# Patient Record
Sex: Female | Born: 1960
Health system: Southern US, Community
[De-identification: ages and names within clinical notes are randomized; demographics above are authoritative.]

## PROBLEM LIST (undated history)

## (undated) DIAGNOSIS — M8580 Other specified disorders of bone density and structure, unspecified site: Secondary | ICD-10-CM

## (undated) DIAGNOSIS — J189 Pneumonia, unspecified organism: Secondary | ICD-10-CM

## (undated) DIAGNOSIS — I10 Essential (primary) hypertension: Secondary | ICD-10-CM

## (undated) HISTORY — PX: ABDOMINAL HYSTERECTOMY: SHX81

## (undated) HISTORY — DX: Other specified disorders of bone density and structure, unspecified site: M85.80

---

## 1996-01-16 HISTORY — PX: TUBAL LIGATION: SHX77

## 1996-01-16 HISTORY — PX: OOPHORECTOMY: SHX86

## 2003-01-16 HISTORY — PX: ENDOMETRIAL ABLATION: SHX621

## 2010-12-09 ENCOUNTER — Emergency Department
Admission: EM | Admit: 2010-12-09 | Discharge: 2010-12-09 | Disposition: A | Discharge status: Home Health | Payer: 59 | Source: Home / Self Care | Attending: Family Medicine | Admitting: Family Medicine

## 2010-12-09 DIAGNOSIS — J069 Acute upper respiratory infection, unspecified: Secondary | ICD-10-CM

## 2010-12-09 MED ORDER — BENZONATATE 200 MG PO CAPS: 200.0000 mg | ORAL_CAPSULE | Freq: Every day | ORAL | Status: AC

## 2010-12-09 MED ORDER — AZITHROMYCIN 250 MG PO TABS: ORAL_TABLET | ORAL | Status: AC

## 2010-12-09 NOTE — Discharge Instructions (Signed)
Take Mucinex D (guaifenesin with decongestant) twice daily for congestion.  Increase fluid intake, rest. May use Afrin nasal spray (or generic oxymetazoline) twice daily for about 5 days.  Also recommend using saline nasal spray several times daily and/or saline nasal irrigation. Stop all antihistamines for now, and other non-prescription cough/cold preparations. Begin Azithromycin if not improving about 5 days or if persistent fever develops. Follow-up with family doctor if not improving 7 to 10 days.

## 2010-12-09 NOTE — ED Notes (Signed)
States URI and productive cough x 8-9 days.

## 2010-12-09 NOTE — ED Provider Notes (Signed)
History     CSN: [id number] Arrival date & time: 12/09/2010  9:48 AM   First MD Initiated Contact with Patient 12/09/10 1009      Chief Complaint  Patient presents with  . Sinusitis  . URI     HPI Comments: Patient complains of approximately 9 day history of gradually progressive URI symptoms beginning with a mild sore throat (now resolved), followed by progressive nasal congestion.  A cough started about 7 days ago.  Complains of fatigue and initial myalgias.  Cough is now worse at night and generally non-productive during the day.  There has been no pleuritic pain, shortness of breath, or wheezes.  However, she often coughs until she gags.  She has anterior chest soreness.  She has a history of pneumonia about 5 years ago.  Patient is a 50 y.o. female presenting with URI. The history is provided by the patient.  URI The primary symptoms include fever, fatigue, cough and myalgias. Primary symptoms do not include headaches, ear pain, sore throat, swollen glands, wheezing, abdominal pain, nausea, vomiting, arthralgias or rash. The current episode started more than 1 week ago. This is a new problem.  Symptoms associated with the illness include chills, congestion and rhinorrhea. The illness is not associated with plugged ear sensation, facial pain or sinus pressure.    History reviewed. No pertinent past medical history.  Past Surgical History  Procedure Date  . Abdominal hysterectomy   . Cesarean section     twice    Family History  Problem Relation Age of Onset  . Cancer Mother     colon 2010  . Diabetes Mother   . Coronary artery disease Father     History  Substance Use Topics  . Smoking status: Never Smoker   . Smokeless tobacco: Not on file  . Alcohol Use: Yes    OB History    Grav Para Term Preterm Abortions TAB SAB Ect Mult Living                  Review of Systems  Constitutional: Positive for fever, chills and fatigue.  HENT: Positive for congestion and  rhinorrhea. Negative for ear pain, sore throat and sinus pressure.   Eyes: Negative.   Respiratory: Positive for cough. Negative for chest tightness, shortness of breath and wheezing.   Cardiovascular: Negative.   Gastrointestinal: Negative for nausea, vomiting and abdominal pain.  Genitourinary: Negative.   Musculoskeletal: Positive for myalgias. Negative for arthralgias.  Skin: Negative.  Negative for rash.  Neurological: Negative for headaches.    Allergies  Shrimp  Home Medications   Current Outpatient Rx  Name Route Sig Dispense Refill  . DEXTROMETHORPHAN-GUAIFENESIN 30-600 MG PO TB12 Oral Take 1 tablet by mouth every 12 (twelve) hours.      Marland Kitchen DIPHENHYDRAMINE HCL (SLEEP) 25 MG PO TABS Oral Take 25 mg by mouth at bedtime as needed.      . IBUPROFEN 200 MG PO TABS Oral Take 600 mg by mouth every 6 (six) hours as needed.      . AZITHROMYCIN 250 MG PO TABS  Take 2 tabs today; then begin one tab once daily for 4 more days. (Rx void after 12/16/10)    6 each 0  . BENZONATATE 200 MG PO CAPS Oral Take 1 capsule (200 mg total) by mouth at bedtime. Take as needed for cough 12 capsule 0    BP 141/88  Pulse 69  Temp(Src) 98 F (36.7 C) (Oral)  Resp 24  Ht 5\' 8"  (1.727 m)  Wt 146 lb 8 oz (66.452 kg)  BMI 22.28 kg/m2  SpO2 100%  Physical Exam  Constitutional: She is oriented to person, place, and time. She appears well-developed and well-nourished. No distress.  HENT:  Head: Normocephalic.  Right Ear: External ear normal.  Left Ear: External ear normal.  Nose: Mucosal edema and rhinorrhea present. No sinus tenderness.  Mouth/Throat: Oropharynx is clear and moist. No oropharyngeal exudate.  Eyes: Conjunctivae and EOM are normal. Pupils are equal, round, and reactive to light. Right eye exhibits no discharge. Left eye exhibits no discharge.  Neck: Normal range of motion. Neck supple.  Cardiovascular: Normal rate, regular rhythm and normal heart sounds.   Pulmonary/Chest: Effort  normal and breath sounds normal. No respiratory distress. She has no wheezes. She has no rales. She exhibits no tenderness.  Abdominal: Soft. Bowel sounds are normal. There is no tenderness.  Musculoskeletal: She exhibits no edema and no tenderness.  Lymphadenopathy:    She has cervical adenopathy.       Right cervical: Posterior cervical adenopathy present.       Left cervical: Posterior cervical adenopathy present.  Neurological: She is alert and oriented to person, place, and time.  Skin: Skin is warm and dry. She is not diaphoretic.    ED Course  Procedures none      1. Acute upper respiratory infections of unspecified site       MDM  There is no evidence of bacterial infection today.   Take Mucinex D (guaifenesin with decongestant) twice daily for congestion.  Increase fluid intake, rest. May use Afrin nasal spray (or generic oxymetazoline) twice daily for about 5 days.  Also recommend using saline nasal spray several times daily and/or saline nasal irrigation. Stop all antihistamines for now, and other non-prescription cough/cold preparations. Begin Azithromycin if not improving about 5 days or if persistent fever develops. Follow-up with family doctor if not improving 7 to 10 days.           Donna Christen, MD 12/13/10 [phone]

## 2012-01-16 HISTORY — PX: COLONOSCOPY: SHX174

## 2012-06-22 ENCOUNTER — Encounter: Payer: Self-pay | Admitting: Emergency Medicine

## 2012-06-22 ENCOUNTER — Emergency Department
Admission: EM | Admit: 2012-06-22 | Discharge: 2012-06-22 | Disposition: A | Discharge status: Home / Self Care | Payer: 59 | Source: Home / Self Care | Attending: Family Medicine | Admitting: Family Medicine

## 2012-06-22 DIAGNOSIS — N3000 Acute cystitis without hematuria: Secondary | ICD-10-CM

## 2012-06-22 HISTORY — DX: Pneumonia, unspecified organism: J18.9

## 2012-06-22 LAB — POCT URINALYSIS DIP (MANUAL ENTRY)
Bilirubin, UA: NEGATIVE
Glucose, UA: 100
Nitrite, UA: POSITIVE
Urobilinogen, UA: 1 (ref 0–1)

## 2012-06-22 MED ORDER — NITROFURANTOIN MONOHYD MACRO 100 MG PO CAPS: 100.0000 mg | ORAL_CAPSULE | Freq: Two times a day (BID) | ORAL | Status: DC

## 2012-06-22 NOTE — ED Notes (Signed)
Dysuria since last night. Took AZO this a.m.

## 2012-06-22 NOTE — ED Provider Notes (Signed)
History     CSN: [id number]  Arrival date & time 06/22/12  1434   First MD Initiated Contact with Patient 06/22/12 1604      Chief Complaint  Patient presents with  . Dysuria       HPI Comments: Patient developed dysuria and frequency last night.  Patient is a 52 y.o. female presenting with dysuria. The history is provided by the patient.  Dysuria Pain quality:  Burning Pain severity:  Mild Onset quality:  Sudden Duration:  1 day Timing:  Constant Progression:  Worsening Chronicity:  New Recent urinary tract infections: no   Relieved by:  Nothing Ineffective treatments: AZO. Urinary symptoms: frequent urination, hematuria and hesitancy   Urinary symptoms: no discolored urine, no foul-smelling urine and no bladder incontinence   Associated symptoms: no abdominal pain, no fever, no flank pain, no nausea, no vaginal discharge and no vomiting     Past Medical History  Diagnosis Date  . Pneumonia     Past Surgical History  Procedure Laterality Date  . Abdominal hysterectomy    . Cesarean section      twice    Family History  Problem Relation Age of Onset  . Cancer Mother     colon 2010  . Diabetes Mother   . Coronary artery disease Father     History  Substance Use Topics  . Smoking status: Never Smoker   . Smokeless tobacco: Not on file  . Alcohol Use: Yes    OB History   Grav Para Term Preterm Abortions TAB SAB Ect Mult Living                  Review of Systems  Constitutional: Negative for fever.  Gastrointestinal: Negative for nausea, vomiting and abdominal pain.  Genitourinary: Positive for dysuria. Negative for flank pain and vaginal discharge.  All other systems reviewed and are negative.    Allergies  Shrimp  Home Medications   Current Outpatient Rx  Name  Route  Sig  Dispense  Refill  . dextromethorphan-guaiFENesin (MUCINEX DM) 30-600 MG per 12 hr tablet   Oral   Take 1 tablet by mouth every 12 (twelve) hours.           .  diphenhydrAMINE (SOMINEX) 25 MG tablet   Oral   Take 25 mg by mouth at bedtime as needed.           Marland Kitchen ibuprofen (ADVIL,MOTRIN) 200 MG tablet   Oral   Take 600 mg by mouth every 6 (six) hours as needed.           . nitrofurantoin, macrocrystal-monohydrate, (MACROBID) 100 MG capsule   Oral   Take 1 capsule (100 mg total) by mouth 2 (two) times daily.   14 capsule   0     BP 139/64  Pulse 67  Temp(Src) 98.3 F (36.8 C) (Oral)  Resp 16  Ht 5\' 8"  (1.727 m)  Wt 140 lb (63.504 kg)  BMI 21.29 kg/m2  SpO2 100%  Physical Exam Nursing notes and Vital Signs reviewed. Appearance:  Patient appears healthy, stated age, and in no acute distress Eyes:  Pupils are equal, round, and reactive to light and accomodation.  Extraocular movement is intact.  Conjunctivae are not inflamed   Mouth/Pharynx:  Normal; moist mucous membranes  Neck:  Supple.  No adenopathy Lungs:  Clear to auscultation.  Breath sounds are equal.  Heart:  Regular rate and rhythm without murmurs, rubs, or gallops.  Abdomen:  Nontender without  masses or hepatosplenomegaly.  Bowel sounds are present.  No CVA or flank tenderness.  Extremities:  No edema.  No calf tenderness Skin:  No rash present.   ED Course  Procedures  none  Labs Reviewed  POCT URINALYSIS DIP (MANUAL ENTRY):  GLU 100mg /dL; BLO large; PRO 100mg /dL; NIT Positive; LEU large      1. Acute cystitis       MDM  Begin Macrobid.  Urine culture pending Increase fluid intake.  May continue AZO for two days. Followup with Family Doctor if not improved in one week.         Lattie Haw, MD 06/25/12 [phone]

## 2012-06-24 LAB — URINE CULTURE: Colony Count: 70000

## 2012-06-25 ENCOUNTER — Telehealth: Payer: Self-pay | Admitting: Emergency Medicine

## 2012-09-29 ENCOUNTER — Encounter: Payer: Self-pay | Admitting: Family

## 2012-09-29 ENCOUNTER — Ambulatory Visit (INDEPENDENT_AMBULATORY_CARE_PROVIDER_SITE_OTHER): Payer: 59 | Admitting: Family

## 2012-09-29 ENCOUNTER — Ambulatory Visit (HOSPITAL_BASED_OUTPATIENT_CLINIC_OR_DEPARTMENT_OTHER)
Admission: RE | Admit: 2012-09-29 | Discharge: 2012-09-29 | Disposition: A | Discharge status: Home / Self Care | Payer: 59 | Source: Ambulatory Visit | Attending: Family | Admitting: Family

## 2012-09-29 ENCOUNTER — Other Ambulatory Visit: Payer: Self-pay | Admitting: Family

## 2012-09-29 VITALS — BP 128/88 | HR 76 | Temp 97.8°F | Resp 16 | Ht 67.5 in | Wt 141.1 lb

## 2012-09-29 DIAGNOSIS — Z0000 Encounter for general adult medical examination without abnormal findings: Secondary | ICD-10-CM | POA: Insufficient documentation

## 2012-09-29 DIAGNOSIS — Z1231 Encounter for screening mammogram for malignant neoplasm of breast: Secondary | ICD-10-CM

## 2012-09-29 DIAGNOSIS — Z136 Encounter for screening for cardiovascular disorders: Secondary | ICD-10-CM

## 2012-09-29 NOTE — Patient Instructions (Addendum)
You will be contacted about your referral to Dr. Leone Payor for colonoscopy. Please schedule your bone density at the front desk. Schedule mammogram on the first floor in the imaging department. Return fasting at your earliest convenience for lab work. Follow up in 1 year for annual physical, sooner if problems/concerns. Welcome to [facility]!

## 2012-09-29 NOTE — Progress Notes (Signed)
Subjective:    Patient ID: Dominique Rice, female    DOB: Dec 15, 1960, 52 y.o.   MRN: [id number]  HPI  Dominique Rice is a 52 yr old female who presents today to establish care.  Has not had a pcp.    Patient presents today for complete physical.  Immunizations: will get flu shot with work. Tetanus up to date. Diet: reports healthy diet.  Exercise: not exercising regularly Colonoscopy: due  Dexa: due Pap Smear: s/p hysterectomy (has 1 ovary)  Mammogram:  due    Review of Systems  Constitutional: Negative for unexpected weight change.  HENT: Negative for hearing loss and congestion.   Eyes: Negative for visual disturbance.  Respiratory: Negative for cough.   Cardiovascular: Negative for chest pain, palpitations and leg swelling.  Gastrointestinal: Negative for vomiting, diarrhea and constipation.  Genitourinary: Negative for dysuria and frequency.  Musculoskeletal: Negative for back pain.       Mild arthritis in hands  Skin: Negative for rash.  Neurological: Negative for headaches.  Hematological: Negative for adenopathy.  Psychiatric/Behavioral:       Denies depression/anxiety   Past Medical History  Diagnosis Date  . Pneumonia     History   Social History  . Marital Status: Married    Spouse Name: N/A    Number of Children: N/A  . Years of Education: N/A   Occupational History  . Not on file.   Social History Main Topics  . Smoking status: Never Smoker   . Smokeless tobacco: Not on file  . Alcohol Use: Yes  . Drug Use: No  . Sexual Activity: Not on file   Other Topics Concern  . 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 Abdominal hysterectomy    . Cesarean section      twice  . Tubal ligation  1998  . Endometrial ablation  2005  .  Oophorectomy Left 1998    Family History  Problem Relation Age of Onset  . Cancer Mother 33    colon 2010  . Diabetes Mother     type II  . Coronary artery disease Father     61 had CABG    Allergies  Allergen Reactions  . Shrimp [Shellfish Allergy] Nausea And Vomiting    09/29/12  Pt states she can tolerate betadine and IV dyes. Tf,cma(aama)    No current outpatient prescriptions on file prior to visit.   No current facility-administered medications on file prior to visit.    BP 128/88  Pulse 76  Temp(Src) 97.8 F (36.6 C) (Oral)  Resp 16  Ht 5' 7.5" (1.715 m)  Wt 141 lb 1.3 oz (63.993 kg)  BMI 21.76 kg/m2  SpO2 96%       Objective:   Physical Exam  Physical Exam  Constitutional: She is oriented to person, place, and time. She appears well-developed and well-nourished. No distress.  HENT:  Head: Normocephalic and atraumatic.  Right Ear: Tympanic membrane and ear canal normal.  Left Ear: Tympanic membrane and ear canal normal.  Mouth/Throat: Oropharynx is clear and moist.  Eyes: Pupils are equal, round, and reactive to light. No scleral icterus.  Neck: Normal range of motion. No thyromegaly present.  Cardiovascular: Normal rate and regular rhythm.   No murmur heard. Pulmonary/Chest: Effort normal and breath sounds normal. No respiratory distress. He has no wheezes. She has no rales. She exhibits no tenderness.  Abdominal: Soft. Bowel sounds are normal. He exhibits no distension and no mass. There is no tenderness. There is no rebound and no guarding.  Musculoskeletal: She exhibits no edema.  Lymphadenopathy:    She has no cervical adenopathy.  Neurological: She is alert and oriented to person, place, and time. She has normal patellar reflexes. She exhibits normal muscle tone. Coordination normal.  Skin: Skin is warm and dry.  Psychiatric: She has a normal mood and affect. Her behavior is normal. Judgment and thought content normal.  Breasts: Examined  lying Right: Without masses, retractions, discharge or axillary adenopathy.  Left: Without masses, retractions, discharge or axillary adenopathy.  Pelvic: deferred to gyn      Assessment & Plan:         Assessment & Plan:

## 2012-09-29 NOTE — Assessment & Plan Note (Addendum)
Recommended regular exercise. Refer for screening colo, dexa, mammogram. She will return fasting for lab work. EKG today.

## 2012-09-30 ENCOUNTER — Ambulatory Visit (INDEPENDENT_AMBULATORY_CARE_PROVIDER_SITE_OTHER)
Admission: RE | Admit: 2012-09-30 | Discharge: 2012-09-30 | Disposition: A | Discharge status: Home / Self Care | Payer: 59 | Source: Ambulatory Visit | Attending: Family | Admitting: Family

## 2012-09-30 DIAGNOSIS — Z0000 Encounter for general adult medical examination without abnormal findings: Secondary | ICD-10-CM

## 2012-09-30 LAB — CBC WITH DIFFERENTIAL/PLATELET
Basophils Absolute: 0 10*3/uL (ref 0.0–0.1)
Basophils Relative: 1 % (ref 0–1)
Eosinophils Relative: 1 % (ref 0–5)
Lymphocytes Relative: 46 % (ref 12–46)
MCHC: 34.2 g/dL (ref 30.0–36.0)
Monocytes Absolute: 0.3 10*3/uL (ref 0.1–1.0)
Neutro Abs: 2.1 10*3/uL (ref 1.7–7.7)
Platelets: 254 10*3/uL (ref 150–400)
RDW: 13.2 % (ref 11.5–15.5)
WBC: 4.5 10*3/uL (ref 4.0–10.5)

## 2012-09-30 LAB — HEPATIC FUNCTION PANEL
Alkaline Phosphatase: 49 U/L (ref 39–117)
Bilirubin, Direct: 0.1 mg/dL (ref 0.0–0.3)
Indirect Bilirubin: 0.5 mg/dL (ref 0.0–0.9)
Total Protein: 7.1 g/dL (ref 6.0–8.3)

## 2012-09-30 LAB — LIPID PANEL
HDL: 75 mg/dL (ref >39)
LDL Cholesterol: 96 mg/dL (ref 0–99)
Triglycerides: 58 mg/dL (ref <150)

## 2012-09-30 LAB — BASIC METABOLIC PANEL WITH GFR
BUN: 19 mg/dL (ref 6–23)
Calcium: 8.9 mg/dL (ref 8.4–10.5)
Creat: 0.72 mg/dL (ref 0.50–1.10)
GFR, Est Non African American: >89 mL/min

## 2012-09-30 LAB — TSH: TSH: 1.643 u[IU]/mL (ref 0.350–4.500)

## 2012-10-01 ENCOUNTER — Encounter: Payer: Self-pay | Admitting: Family

## 2012-10-01 LAB — URINALYSIS, ROUTINE W REFLEX MICROSCOPIC
Bilirubin Urine: NEGATIVE
Glucose, UA: NEGATIVE mg/dL
Ketones, ur: NEGATIVE mg/dL
Specific Gravity, Urine: 1.01 (ref 1.005–1.030)

## 2012-10-06 ENCOUNTER — Encounter: Payer: Self-pay | Admitting: Internal Medicine

## 2012-10-15 ENCOUNTER — Encounter: Payer: Self-pay | Admitting: Family

## 2012-10-20 ENCOUNTER — Encounter: Payer: Self-pay | Admitting: Family

## 2012-10-20 ENCOUNTER — Telehealth: Payer: Self-pay | Admitting: Family

## 2012-10-20 DIAGNOSIS — M8580 Other specified disorders of bone density and structure, unspecified site: Secondary | ICD-10-CM | POA: Insufficient documentation

## 2012-10-20 NOTE — Telephone Encounter (Signed)
See my chart message

## 2012-11-20 ENCOUNTER — Other Ambulatory Visit: Payer: Self-pay

## 2012-11-25 ENCOUNTER — Encounter: Payer: Self-pay | Admitting: Family

## 2012-12-02 ENCOUNTER — Ambulatory Visit (AMBULATORY_SURGERY_CENTER): Payer: Self-pay | Admitting: *Deleted

## 2012-12-02 VITALS — Ht 68.0 in | Wt 145.2 lb

## 2012-12-02 DIAGNOSIS — Z800 Family history of malignant neoplasm of digestive organs: Secondary | ICD-10-CM

## 2012-12-02 DIAGNOSIS — Z1211 Encounter for screening for malignant neoplasm of colon: Secondary | ICD-10-CM

## 2012-12-02 MED ORDER — NA SULFATE-K SULFATE-MG SULF 17.5-3.13-1.6 GM/177ML PO SOLN: 1.0000 | Freq: Once | ORAL | Status: DC

## 2012-12-02 NOTE — Progress Notes (Signed)
Denies allergies to eggs or soy products. Denies complications with sedation or anesthesia. 

## 2012-12-15 ENCOUNTER — Ambulatory Visit (AMBULATORY_SURGERY_CENTER): Payer: 59 | Admitting: Internal Medicine

## 2012-12-15 ENCOUNTER — Encounter: Payer: Self-pay | Admitting: Internal Medicine

## 2012-12-15 VITALS — BP 125/90 | HR 59 | Temp 97.2°F | Resp 20 | Ht 68.0 in | Wt 145.0 lb

## 2012-12-15 DIAGNOSIS — Z1211 Encounter for screening for malignant neoplasm of colon: Secondary | ICD-10-CM

## 2012-12-15 DIAGNOSIS — K5730 Diverticulosis of large intestine without perforation or abscess without bleeding: Secondary | ICD-10-CM

## 2012-12-15 MED ORDER — SODIUM CHLORIDE 0.9 % IV SOLN: 500.0000 mL | INTRAVENOUS | Status: DC

## 2012-12-15 NOTE — Patient Instructions (Addendum)
You have very mild diverticulosis but everything else was ok - normal - and the prep was great.  Next routine colonoscopy in 10 years - 2024.  I appreciate the opportunity to care for you. Iva Boop, MD, FACG   YOU HAD AN ENDOSCOPIC PROCEDURE TODAY AT THE [facility]: Refer to the procedure report that was given to you for any specific questions about what was found during the examination.  If the procedure report does not answer your questions, please call your gastroenterologist to clarify.  If you requested that your care partner not be given the details of your procedure findings, then the procedure report has been included in a sealed envelope for you to review at your convenience later.  YOU SHOULD EXPECT: Some feelings of bloating in the abdomen. Passage of more gas than usual.  Walking can help get rid of the air that was put into your GI tract during the procedure and reduce the bloating. If you had a lower endoscopy (such as a colonoscopy or flexible sigmoidoscopy) you may notice spotting of blood in your stool or on the toilet paper. If you underwent a bowel prep for your procedure, then you may not have a normal bowel movement for a few days.  DIET: Your first meal following the procedure should be a light meal and then it is ok to progress to your normal diet.  A half-sandwich or bowl of soup is an example of a good first meal.  Heavy or fried foods are harder to digest and may make you feel nauseous or bloated.  Likewise meals heavy in dairy and vegetables can cause extra gas to form and this can also increase the bloating.  Drink plenty of fluids but you should avoid alcoholic beverages for 24 hours.  ACTIVITY: Your care partner should take you home directly after the procedure.  You should plan to take it easy, moving slowly for the rest of the day.  You can resume normal activity the day after the procedure however you should NOT DRIVE or use heavy machinery for 24  hours (because of the sedation medicines used during the test).    SYMPTOMS TO REPORT IMMEDIATELY: A gastroenterologist can be reached at any hour.  During normal business hours, 8:30 AM to 5:00 PM Monday through Friday, call [phone].  After hours and on weekends, please call the GI answering service at [phone] who will take a message and have the physician on call contact you.   Following lower endoscopy (colonoscopy or flexible sigmoidoscopy):  Excessive amounts of blood in the stool  Significant tenderness or worsening of abdominal pains  Swelling of the abdomen that is new, acute  Fever of 100F or higher FOLLOW UP: If any biopsies were taken you will be contacted by phone or by letter within the next 1-3 weeks.  Call your gastroenterologist if you have not heard about the biopsies in 3 weeks.  Our staff will call the home number listed on your records the next business day following your procedure to check on you and address any questions or concerns that you may have at that time regarding the information given to you following your procedure. This is a courtesy call and so if there is no answer at the home number and we have not heard from you through the emergency physician on call, we will assume that you have returned to your regular daily activities without incident.  SIGNATURES/CONFIDENTIALITY: You and/or your care partner have  signed paperwork which will be entered into your electronic medical record.  These signatures attest to the fact that that the information above on your After Visit Summary has been reviewed and is understood.  Full responsibility of the confidentiality of this discharge information lies with you and/or your care-partner.   Diverticulosis information given.  Next colonoscopy 2024-10 years!

## 2012-12-15 NOTE — Op Note (Signed)
[facility] [address] [place], [address]   COLONOSCOPY PROCEDURE REPORT  PATIENT: Dominique Rice, Dominique Rice  MR#: [id number] BIRTHDATE: 12-06-1960 , 52  yrs. old GENDER: Female ENDOSCOPIST: Iva Boop, MD, [facility] REFERRED NW:[id number] Peggyann Juba, FNP PROCEDURE DATE:  12/15/2012 PROCEDURE:   Colonoscopy, screening First Screening Colonoscopy - Avg.  risk and is 50 yrs.  old or older Yes.  Prior Negative Screening - Now for repeat screening. N/A  History of Adenoma - Now for follow-up colonoscopy & has been > or = to 3 yrs.  N/A  Polyps Removed Today? No.  Recommend repeat exam, <10 yrs? No. ASA CLASS:   Class II INDICATIONS:average risk screening and first colonoscopy. MEDICATIONS: propofol (Diprivan) 200mg  IV, MAC sedation, administered by CRNA, and These medications were titrated to patient response per physician's verbal order  DESCRIPTION OF PROCEDURE:   After the risks benefits and alternatives of the procedure were thoroughly explained, informed consent was obtained.  A digital rectal exam revealed no abnormalities of the rectum.   The LB [id number]  endoscope was introduced through the anus and advanced to the cecum, which was identified by both the appendix and ileocecal valve. No adverse events experienced.   The quality of the prep was excellent using Suprep  The instrument was then slowly withdrawn as the colon was fully examined.      COLON FINDINGS: There was mild scattered diverticulosis noted in the sigmoid colon.   The colon mucosa was otherwise normal.   A right colon retroflexion was performed.  Retroflexed views revealed no abnormalities. The time to cecum=2 minutes 42 seconds.  Withdrawal time=8 minutes 28 seconds.  The scope was withdrawn and the procedure completed. COMPLICATIONS: There were no complications.  ENDOSCOPIC IMPRESSION: 1.   There was mild diverticulosis noted in the sigmoid colon 2.   The colon mucosa was otherwise  normal - excellent prep - first colonoscopy  RECOMMENDATIONS: Repeat routine colonoscopy 10 years - 2024   eSigned:  Iva Boop, MD, [facility] 12/15/2012 11:25 AM  cc: Sandford Craze FNP and The Patient

## 2012-12-15 NOTE — Progress Notes (Signed)
Patient did not experience any of the following events: a burn prior to discharge; a fall within the facility; wrong site/side/patient/procedure/implant event; or a hospital transfer or hospital admission upon discharge from the facility. (G8907) Patient did not have preoperative order for IV antibiotic SSI prophylaxis. (G8918)  

## 2012-12-16 ENCOUNTER — Telehealth: Payer: Self-pay | Admitting: *Deleted

## 2012-12-16 NOTE — Telephone Encounter (Signed)
Message left

## 2013-09-14 ENCOUNTER — Other Ambulatory Visit: Payer: Self-pay | Admitting: Family

## 2013-09-14 DIAGNOSIS — Z1231 Encounter for screening mammogram for malignant neoplasm of breast: Secondary | ICD-10-CM

## 2013-10-09 ENCOUNTER — Ambulatory Visit (HOSPITAL_BASED_OUTPATIENT_CLINIC_OR_DEPARTMENT_OTHER): Payer: 59

## 2013-10-13 ENCOUNTER — Ambulatory Visit (HOSPITAL_BASED_OUTPATIENT_CLINIC_OR_DEPARTMENT_OTHER)
Admission: RE | Admit: 2013-10-13 | Discharge: 2013-10-13 | Disposition: A | Discharge status: Home / Self Care | Payer: 59 | Source: Ambulatory Visit | Attending: Family | Admitting: Family

## 2013-10-13 DIAGNOSIS — Z803 Family history of malignant neoplasm of breast: Secondary | ICD-10-CM | POA: Diagnosis not present

## 2013-10-13 DIAGNOSIS — Z1231 Encounter for screening mammogram for malignant neoplasm of breast: Secondary | ICD-10-CM | POA: Diagnosis present

## 2013-11-24 ENCOUNTER — Encounter: Payer: Self-pay | Admitting: Family

## 2013-11-24 ENCOUNTER — Ambulatory Visit (INDEPENDENT_AMBULATORY_CARE_PROVIDER_SITE_OTHER): Payer: 59 | Admitting: Family

## 2013-11-24 VITALS — BP 120/70 | HR 63 | Temp 97.9°F | Resp 16 | Ht 67.5 in | Wt 150.4 lb

## 2013-11-24 DIAGNOSIS — Z0000 Encounter for general adult medical examination without abnormal findings: Secondary | ICD-10-CM

## 2013-11-24 LAB — LIPID PANEL
Cholesterol: 192 mg/dL (ref 0–200)
HDL: 66.8 mg/dL (ref >39.00)
LDL Cholesterol: 114 mg/dL — ABNORMAL HIGH (ref 0–99)
NONHDL: 125.2
Total CHOL/HDL Ratio: 3
Triglycerides: 56 mg/dL (ref 0.0–149.0)
VLDL: 11.2 mg/dL (ref 0.0–40.0)

## 2013-11-24 LAB — CBC WITH DIFFERENTIAL/PLATELET
BASOS PCT: 0.4 % (ref 0.0–3.0)
Basophils Absolute: 0 10*3/uL (ref 0.0–0.1)
EOS ABS: 0 10*3/uL (ref 0.0–0.7)
EOS PCT: 0.5 % (ref 0.0–5.0)
HEMATOCRIT: 38.5 % (ref 36.0–46.0)
Hemoglobin: 12.7 g/dL (ref 12.0–15.0)
LYMPHS ABS: 1.6 10*3/uL (ref 0.7–4.0)
Lymphocytes Relative: 34 % (ref 12.0–46.0)
MCHC: 33.1 g/dL (ref 30.0–36.0)
MCV: 91.9 fl (ref 78.0–100.0)
MONO ABS: 0.3 10*3/uL (ref 0.1–1.0)
Monocytes Relative: 6.4 % (ref 3.0–12.0)
NEUTROS ABS: 2.8 10*3/uL (ref 1.4–7.7)
NEUTROS PCT: 58.7 % (ref 43.0–77.0)
Platelets: 271 10*3/uL (ref 150.0–400.0)
RBC: 4.19 Mil/uL (ref 3.87–5.11)
RDW: 13.1 % (ref 11.5–15.5)
WBC: 4.7 10*3/uL (ref 4.0–10.5)

## 2013-11-24 LAB — HEPATIC FUNCTION PANEL
ALBUMIN: 3.5 g/dL (ref 3.5–5.2)
ALT: 13 U/L (ref 0–35)
AST: 23 U/L (ref 0–37)
Alkaline Phosphatase: 53 U/L (ref 39–117)
BILIRUBIN DIRECT: 0 mg/dL (ref 0.0–0.3)
Total Bilirubin: 0.5 mg/dL (ref 0.2–1.2)
Total Protein: 7.1 g/dL (ref 6.0–8.3)

## 2013-11-24 LAB — BASIC METABOLIC PANEL
BUN: 13 mg/dL (ref 6–23)
CHLORIDE: 104 meq/L (ref 96–112)
CO2: 28 mEq/L (ref 19–32)
CREATININE: 0.6 mg/dL (ref 0.4–1.2)
Calcium: 9.1 mg/dL (ref 8.4–10.5)
GFR: 111.08 mL/min (ref >60.00)
Glucose, Bld: 78 mg/dL (ref 70–99)
Potassium: 4.3 mEq/L (ref 3.5–5.1)
Sodium: 139 mEq/L (ref 135–145)

## 2013-11-24 LAB — TSH: TSH: 1.28 u[IU]/mL (ref 0.35–4.50)

## 2013-11-24 NOTE — Progress Notes (Signed)
Pre visit review using our clinic review tool, if applicable. No additional management support is needed unless otherwise documented below in the visit note. 

## 2013-11-24 NOTE — Progress Notes (Signed)
Subjective:    Patient ID: Dominique Rice, female    DOB: 1960/01/23, 53 y.o.   MRN: [id number]  HPI   Subjective:     Dominique Rice is a 53 y.o. female and is here for a comprehensive physical exam. The patient reports mild sore throat off/on x 1 month, will mild swollen gland on the left.  .  Patient presents today for complete physical.  Immunizations: up to date  Diet: reports healthy diet Exercise: walks 3 days a week- 45 minutes Colonoscopy: 2014- due 2019 Dexa: 2014 Pap Smear: hysterectomy Mammogram:9/15    History   Social History  . Marital Status: Married    Spouse Name: N/A    Number of Children: N/A  . Years of Education: N/A   Occupational History  . Not on file.   Social History Main Topics  . Smoking status: Never Smoker   . Smokeless tobacco: Never Used  . Alcohol Use: 1.8 oz/week    3 Glasses of wine per week  . Drug Use: No  . Sexual Activity: Not on file   Other Topics Concern  . 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 PAP SMEAR  09/28/1978  . INFLUENZA VACCINE  08/15/2013  . TETANUS/TDAP  01/16/2015  . MAMMOGRAM  10/14/2015  . COLONOSCOPY  12/16/2022    The following portions of the patient's history were reviewed and updated as appropriate: allergies, current medications, past family history, past medical history, past social history, past surgical history and problem list.  Review of Systems Objective:   Assessment:    Healthy female exam. Obtain EKG.  Immunizations, colo, dexa, mammo up to date.      Plan:     See After Visit Summary for Counseling Recommendations     Review of Systems  Constitutional: Negative for unexpected weight change.  HENT: Negative for rhinorrhea.   Respiratory: Negative for cough and shortness  of breath.   Cardiovascular: Negative for chest pain and leg swelling.  Gastrointestinal: Negative for nausea, vomiting, diarrhea and blood in stool.  Genitourinary: Negative for dysuria, frequency and hematuria.  Musculoskeletal: Negative for myalgias and arthralgias.  Skin: Negative for rash.  Neurological: Negative for headaches.  Hematological: Does not bruise/bleed easily.  Psychiatric/Behavioral:       Denies depression/anxiety   Past Medical History  Diagnosis Date  . Pneumonia     History   Social History  . Marital Status: Married    Spouse Name: N/A    Number of Children: N/A  . Years of Education: N/A   Occupational History  . Not on file.   Social History Main Topics  . Smoking status: Never Smoker   . Smokeless tobacco: Never Used  . Alcohol Use: 1.8 oz/week    3 Glasses of wine per week  . Drug Use: No  . Sexual Activity: Not on file   Other Topics Concern  . 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 Abdominal hysterectomy    . Cesarean section      twice  . Tubal ligation  1998  . Endometrial ablation  2005  . Oophorectomy Left 1998    Family History  Problem Relation Age of Onset  . Cancer Mother 6172    colon 2010  . Diabetes Mother     type II  . Colon cancer Mother   . Macular degeneration Mother   . Coronary artery disease Father     5467 had CABG  . Esophageal cancer Neg Hx   . Rectal cancer Neg Hx   . Stomach cancer Neg Hx   . Hodgkin's lymphoma Son     Allergies  Allergen Reactions  . Shrimp [Shellfish Allergy] Nausea And Vomiting    09/29/12  Pt states she can tolerate betadine and IV dyes. Tf,cma(aama)    Current Outpatient Prescriptions on File Prior to Visit  Medication Sig Dispense Refill  .  Calcium-Magnesium-Vitamin D (CALCIUM 500 PO) Take 1 tablet by mouth 2 (two) times daily.    . Multiple Vitamin (MULTIVITAMIN) tablet Take 1 tablet by mouth daily.     No current facility-administered medications on file prior to visit.    BP 120/70 mmHg  Pulse 63  Temp(Src) 97.9 F (36.6 C) (Oral)  Resp 16  Ht 5' 7.5" (1.715 m)  Wt 150 lb 6.4 oz (68.221 kg)  BMI 23.19 kg/m2  SpO2 99%        Objective:   Physical Exam Physical Exam  Constitutional: She is oriented to person, place, and time. She appears well-developed and well-nourished. No distress.  HENT:  Head: Normocephalic and atraumatic.  Right Ear: Tympanic membrane and ear canal normal.  Left Ear: Tympanic membrane and ear canal normal.  Mouth/Throat: Oropharynx is clear and moist.  Eyes: Pupils are equal, round, and reactive to light. No scleral icterus.  Neck: Normal range of motion. No thyromegaly present.  Cardiovascular: Normal rate and regular rhythm.   No murmur heard. Pulmonary/Chest: Effort normal and breath sounds normal. No respiratory distress. He has no wheezes. She has no rales. She exhibits no tenderness.  Abdominal: Soft. Bowel sounds are normal. He exhibits no distension and no mass. There is no tenderness. There is no rebound and no guarding.  Musculoskeletal: She exhibits no edema.  Lymphadenopathy:    She has upper limit normal left anterior cervical lymph node Neurological: She is alert and oriented to person, place, and time.  She exhibits normal muscle tone. Coordination normal.  Skin: Skin is warm and dry.  Psychiatric: She has a normal mood and affect. Her behavior is normal. Judgment and thought content normal.  Breasts: Examined lying Right: Without masses, retractions, discharge or axillary adenopathy.  Left: Without masses, retractions, discharge or axillary adenopathy. Pelvic: deferred        Assessment & Plan:    Suspect mild allergies contributing to sore throat.  Recommend  trial of zyrtec, call if symptoms worsen or if symptoms do not improve. Call if mild cervical LN enlargement does not resolve.       Assessment & Plan:

## 2013-11-24 NOTE — Patient Instructions (Signed)
Please complete lab work prior to leaving. Follow up in 1 year, sooner if problems/concerns.  

## 2013-11-25 LAB — URINALYSIS, ROUTINE W REFLEX MICROSCOPIC
BILIRUBIN URINE: NEGATIVE
HGB URINE DIPSTICK: NEGATIVE
KETONES UR: NEGATIVE
Leukocytes, UA: NEGATIVE
Nitrite: NEGATIVE
RBC / HPF: NONE SEEN (ref 0–?)
Specific Gravity, Urine: 1.01 (ref 1.000–1.030)
TOTAL PROTEIN, URINE-UPE24: NEGATIVE
UROBILINOGEN UA: 0.2 (ref 0.0–1.0)
Urine Glucose: NEGATIVE
WBC, UA: NONE SEEN (ref 0–?)
pH: 7 (ref 5.0–8.0)

## 2013-11-26 ENCOUNTER — Encounter: Payer: Self-pay | Admitting: Family

## 2014-03-17 ENCOUNTER — Encounter: Payer: Self-pay | Admitting: Physician Assistant

## 2014-03-17 ENCOUNTER — Ambulatory Visit (INDEPENDENT_AMBULATORY_CARE_PROVIDER_SITE_OTHER): Payer: 59 | Admitting: Physician Assistant

## 2014-03-17 VITALS — BP 159/82 | HR 75 | Temp 98.5°F | Resp 16 | Ht 67.5 in | Wt 152.5 lb

## 2014-03-17 DIAGNOSIS — J0110 Acute frontal sinusitis, unspecified: Secondary | ICD-10-CM

## 2014-03-17 MED ORDER — BENZONATATE 100 MG PO CAPS: 100.0000 mg | ORAL_CAPSULE | Freq: Two times a day (BID) | ORAL | Status: DC | PRN

## 2014-03-17 MED ORDER — AMOXICILLIN-POT CLAVULANATE 875-125 MG PO TABS: 1.0000 | ORAL_TABLET | Freq: Two times a day (BID) | ORAL | Status: DC

## 2014-03-17 NOTE — Patient Instructions (Signed)
Please take antibiotic as directed.  Increase fluid intake.  Use Saline nasal spray.  Take a daily multivitamin. Use Tessalon as directed.  Place a humidifier in the bedroom.  Please call or return clinic if symptoms are not improving.  Sinusitis Sinusitis is redness, soreness, and swelling (inflammation) of the paranasal sinuses. Paranasal sinuses are air pockets within the bones of your face (beneath the eyes, the middle of the forehead, or above the eyes). In healthy paranasal sinuses, mucus is able to drain out, and air is able to circulate through them by way of your nose. However, when your paranasal sinuses are inflamed, mucus and air can become trapped. This can allow bacteria and other germs to grow and cause infection. Sinusitis can develop quickly and last only a short time (acute) or continue over a long period (chronic). Sinusitis that lasts for more than 12 weeks is considered chronic.  CAUSES  Causes of sinusitis include:  Allergies.  Structural abnormalities, such as displacement of the cartilage that separates your nostrils (deviated septum), which can decrease the air flow through your nose and sinuses and affect sinus drainage.  Functional abnormalities, such as when the small hairs (cilia) that line your sinuses and help remove mucus do not work properly or are not present. SYMPTOMS  Symptoms of acute and chronic sinusitis are the same. The primary symptoms are pain and pressure around the affected sinuses. Other symptoms include:  Upper toothache.  Earache.  Headache.  Bad breath.  Decreased sense of smell and taste.  A cough, which worsens when you are lying flat.  Fatigue.  Fever.  Thick drainage from your nose, which often is green and may contain pus (purulent).  Swelling and warmth over the affected sinuses. DIAGNOSIS  Your caregiver will perform a physical exam. During the exam, your caregiver may:  Look in your nose for signs of abnormal growths in  your nostrils (nasal polyps).  Tap over the affected sinus to check for signs of infection.  View the inside of your sinuses (endoscopy) with a special imaging device with a light attached (endoscope), which is inserted into your sinuses. If your caregiver suspects that you have chronic sinusitis, one or more of the following tests may be recommended:  Allergy tests.  Nasal culture A sample of mucus is taken from your nose and sent to a lab and screened for bacteria.  Nasal cytology A sample of mucus is taken from your nose and examined by your caregiver to determine if your sinusitis is related to an allergy. TREATMENT  Most cases of acute sinusitis are related to a viral infection and will resolve on their own within 10 days. Sometimes medicines are prescribed to help relieve symptoms (pain medicine, decongestants, nasal steroid sprays, or saline sprays).  However, for sinusitis related to a bacterial infection, your caregiver will prescribe antibiotic medicines. These are medicines that will help kill the bacteria causing the infection.  Rarely, sinusitis is caused by a fungal infection. In theses cases, your caregiver will prescribe antifungal medicine. For some cases of chronic sinusitis, surgery is needed. Generally, these are cases in which sinusitis recurs more than 3 times per year, despite other treatments. HOME CARE INSTRUCTIONS   Drink plenty of water. Water helps thin the mucus so your sinuses can drain more easily.  Use a humidifier.  Inhale steam 3 to 4 times a day (for example, sit in the bathroom with the shower running).  Apply a warm, moist washcloth to your face 3 to   4 times a day, or as directed by your caregiver.  Use saline nasal sprays to help moisten and clean your sinuses.  Take over-the-counter or prescription medicines for pain, discomfort, or fever only as directed by your caregiver. SEEK IMMEDIATE MEDICAL CARE IF:  You have increasing pain or severe  headaches.  You have nausea, vomiting, or drowsiness.  You have swelling around your face.  You have vision problems.  You have a stiff neck.  You have difficulty breathing. MAKE SURE YOU:   Understand these instructions.  Will watch your condition.  Will get help right away if you are not doing well or get worse. Document Released: 01/01/2005 Document Revised: 03/26/2011 Document Reviewed: 01/16/2011 ExitCare Patient Information 2014 [employer].   

## 2014-03-17 NOTE — Progress Notes (Signed)
  Subjective:     Dominique Rice is a 54 y.o. female who presents for evaluation of symptoms of a URI, possible sinusitis. Symptoms include left ear pressure/pain, facial pain, nasal congestion, non productive cough, sinus pressure, sore throat and tooth pain. Onset of symptoms was 6 weeks ago, and has been gradually worsening since that time. Treatment to date: antihistamines, cough suppressants and decongestants.  The following portions of the patient's history were reviewed and updated as appropriate: allergies, current medications, past family history, past medical history, past social history, past surgical history and problem list.  Review of Systems Pertinent items are noted in HPI.   Objective:    BP 159/82 mmHg  Pulse 75  Temp(Src) 98.5 F (36.9 C) (Oral)  Resp 16  Ht 5' 7.5" (1.715 m)  Wt 152 lb 8 oz (69.174 kg)  BMI 23.52 kg/m2  SpO2 100% General appearance: alert, cooperative, appears stated age and no distress Head: Normocephalic, without obvious abnormality, atraumatic Ears: normal TM's and external ear canals both ears Nose: mild congestion, turbinates red, swollen, sinus tenderness bilateral Throat: lips, mucosa, and tongue normal; teeth and gums normal Lungs: clear to auscultation bilaterally Heart: regular rate and rhythm, S1, S2 normal, no murmur, click, rub or gallop   Assessment:    sinusitis   Plan:    Discussed the diagnosis and treatment of sinusitis. Suggested symptomatic OTC remedies. Nasal saline spray for congestion. Augmentin per orders. Nasal steroids per orders. Follow up as needed. Call in 5 days if symptoms aren't resolving.

## 2014-03-17 NOTE — Progress Notes (Signed)
Pre visit review using our clinic review tool, if applicable. No additional management support is needed unless otherwise documented below in the visit note/SLS  

## 2014-03-18 ENCOUNTER — Telehealth: Payer: Self-pay | Admitting: Family

## 2014-03-18 MED ORDER — DOXYCYCLINE HYCLATE 100 MG PO CAPS: 100.0000 mg | ORAL_CAPSULE | Freq: Two times a day (BID) | ORAL | Status: DC

## 2014-03-18 NOTE — Telephone Encounter (Signed)
Caller name: Rush LandmarkCoble, Smantha W Relation to pt: self  Call back number: [phone]   Reason for call:  Pt states amoxicillin-clavulanate (AUGMENTIN) 875-125 MG per tablet is causesing explosive diarrhea and vommitting. Please advise

## 2014-03-18 NOTE — Telephone Encounter (Signed)
If she is having this issue with only one dose of Augmentin, she is to stop the medication.  I have sent in a prescription for Doxycycline for her to take for sinusitis instead.

## 2014-03-18 NOTE — Telephone Encounter (Signed)
Please advise 

## 2014-03-18 NOTE — Telephone Encounter (Signed)
Spoke with Pt informed her to d/c Augmentin. Informed her Selena BattenCody has sent Doxycycline to her pharmacy. Pt verbalized understanding.

## 2014-07-02 ENCOUNTER — Encounter: Payer: Self-pay | Admitting: Physician Assistant

## 2014-07-02 ENCOUNTER — Ambulatory Visit (INDEPENDENT_AMBULATORY_CARE_PROVIDER_SITE_OTHER): Payer: 59 | Admitting: Physician Assistant

## 2014-07-02 VITALS — BP 144/76 | HR 61 | Temp 98.0°F | Resp 16 | Ht 67.5 in | Wt 152.5 lb

## 2014-07-02 DIAGNOSIS — R591 Generalized enlarged lymph nodes: Secondary | ICD-10-CM

## 2014-07-02 DIAGNOSIS — R599 Enlarged lymph nodes, unspecified: Secondary | ICD-10-CM | POA: Insufficient documentation

## 2014-07-02 LAB — CBC WITH DIFFERENTIAL/PLATELET
Basophils Absolute: 0 10*3/uL (ref 0.0–0.1)
Basophils Relative: 0.4 % (ref 0.0–3.0)
EOS PCT: 0.5 % (ref 0.0–5.0)
Eosinophils Absolute: 0 10*3/uL (ref 0.0–0.7)
HEMATOCRIT: 40.1 % (ref 36.0–46.0)
Hemoglobin: 13.2 g/dL (ref 12.0–15.0)
LYMPHS ABS: 1.9 10*3/uL (ref 0.7–4.0)
Lymphocytes Relative: 38 % (ref 12.0–46.0)
MCHC: 32.9 g/dL (ref 30.0–36.0)
MCV: 91.1 fl (ref 78.0–100.0)
MONOS PCT: 7.1 % (ref 3.0–12.0)
Monocytes Absolute: 0.4 10*3/uL (ref 0.1–1.0)
NEUTROS ABS: 2.8 10*3/uL (ref 1.4–7.7)
Neutrophils Relative %: 54 % (ref 43.0–77.0)
PLATELETS: 249 10*3/uL (ref 150.0–400.0)
RBC: 4.4 Mil/uL (ref 3.87–5.11)
RDW: 13.6 % (ref 11.5–15.5)
WBC: 5.1 10*3/uL (ref 4.0–10.5)

## 2014-07-02 NOTE — Progress Notes (Signed)
Patient presents to clinic today c/o persistent enlarged, non-tender left anterior cervical lymph node since sinus infection 3.5 months ago.  Denies any residual symptoms.  Denies fever, chills, sweats or weight changes.  Denies nodes elsewhere.  Past Medical History  Diagnosis Date  . Pneumonia     Current Outpatient Prescriptions on File Prior to Visit  Medication Sig Dispense Refill  . Calcium-Magnesium-Vitamin D (CALCIUM 500 PO) Take 1 tablet by mouth 2 (two) times daily.    . Multiple Vitamin (MULTIVITAMIN) tablet Take 1 tablet by mouth daily.     No current facility-administered medications on file prior to visit.    Allergies  Allergen Reactions  . Shrimp [Shellfish Allergy] Nausea And Vomiting    09/29/12  Pt states she can tolerate betadine and IV dyes. Tf,cma(aama)    Family History  Problem Relation Age of Onset  . Cancer Mother 78    colon 2010  . Diabetes Mother     type II  . Colon cancer Mother   . Macular degeneration Mother   . Coronary artery disease Father     75 had CABG  . Esophageal cancer Neg Hx   . Rectal cancer Neg Hx   . Stomach cancer Neg Hx   . Hodgkin's lymphoma Son     History   Social History  . Marital Status: Married    Spouse Name: N/A  . Number of Children: N/A  . Years of Education: N/A   Social History Main Topics  . Smoking status: Never Smoker   . Smokeless tobacco: Never Used  . Alcohol Use: 1.8 oz/week    3 Glasses of wine per week  . Drug Use: No  . Sexual Activity: Not on file   Other Topics Concern  . 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  All other ROS are negative.  BP 144/76 mmHg  Pulse 61  Temp(Src) 98 F (36.7 C) (Oral)  Resp 16  Ht 5' 7.5" (1.715 m)  Wt 152 lb 8 oz (69.174 kg)  BMI 23.52 kg/m2  SpO2  100%  Physical Exam  Constitutional: She is oriented to person, place, and time.  HENT:  Head: Normocephalic and atraumatic.  Right Ear: External ear normal.  Left Ear: External ear normal.  Nose: Nose normal.  Mouth/Throat: Oropharynx is clear and moist.  Eyes: Conjunctivae are normal.  Neck: Neck supple.  Cardiovascular: Normal rate, regular rhythm, normal heart sounds and intact distal pulses.   Pulmonary/Chest: Effort normal and breath sounds normal. No respiratory distress. She has no wheezes. She has no rales. She exhibits no tenderness.  Lymphadenopathy:       Head (right side): No submental, no submandibular, no tonsillar, no preauricular, no posterior auricular and no occipital adenopathy present.       Head (left side): No submental, no submandibular, no tonsillar, no preauricular, no posterior auricular and no occipital adenopathy present.    She has cervical adenopathy.       Right cervical: No superficial cervical, no deep cervical and no posterior cervical adenopathy present.      Left cervical: Superficial cervical adenopathy present. No deep cervical and no posterior cervical adenopathy present.    She has no axillary  adenopathy.       Right: No supraclavicular and no epitrochlear adenopathy present.       Left: No supraclavicular and no epitrochlear adenopathy present.  Neurological: She is alert and oriented to person, place, and time.  Skin: Skin is warm and dry. No rash noted.  Psychiatric: Affect normal.  Vitals reviewed.   Recent Results (from the past 2160 hour(s))  CBC w/Diff     Status: None   Collection Time: 07/02/14 11:01 AM  Result Value Ref Range   WBC 5.1 4.0 - 10.5 K/uL   RBC 4.40 3.87 - 5.11 Mil/uL   Hemoglobin 13.2 12.0 - 15.0 g/dL   HCT 40.9 81.1 - 91.4 %   MCV 91.1 78.0 - 100.0 fl   MCHC 32.9 30.0 - 36.0 g/dL   RDW 78.2 95.6 - 21.3 %   Platelets 249.0 150.0 - 400.0 K/uL   Neutrophils Relative % 54.0 43.0 - 77.0 %   Lymphocytes Relative  38.0 12.0 - 46.0 %   Monocytes Relative 7.1 3.0 - 12.0 %   Eosinophils Relative 0.5 0.0 - 5.0 %   Basophils Relative 0.4 0.0 - 3.0 %   Neutro Abs 2.8 1.4 - 7.7 K/uL   Lymphs Abs 1.9 0.7 - 4.0 K/uL   Monocytes Absolute 0.4 0.1 - 1.0 K/uL   Eosinophils Absolute 0.0 0.0 - 0.7 K/uL   Basophils Absolute 0.0 0.0 - 0.1 K/uL    Assessment/Plan: Enlarged lymph node Asymptomatic. Node palpable feeling about 1 cm in diameter and mobile.  Non tender or hardened.  No other adenopathy noted. Will check CBC w diff.  Will proceed with imaging based on results.

## 2014-07-02 NOTE — Patient Instructions (Signed)
Please go to the lab for blood work. I will call you with your results and we will proceed with imaging from there.  Be comforted that in most cases everything looks good but we will be proactive to make sure there is nothing to worry about!

## 2014-07-02 NOTE — Assessment & Plan Note (Signed)
Asymptomatic. Node palpable feeling about 1 cm in diameter and mobile.  Non tender or hardened.  No other adenopathy noted. Will check CBC w diff.  Will proceed with imaging based on results.

## 2014-07-02 NOTE — Progress Notes (Signed)
Pre visit review using our clinic review tool, if applicable. No additional management support is needed unless otherwise documented below in the visit note/SLS  

## 2014-07-05 ENCOUNTER — Encounter: Payer: Self-pay | Admitting: Physician Assistant

## 2014-07-06 ENCOUNTER — Telehealth: Payer: Self-pay | Admitting: Family

## 2014-07-06 ENCOUNTER — Other Ambulatory Visit: Payer: Self-pay | Admitting: Physician Assistant

## 2014-07-06 DIAGNOSIS — R221 Localized swelling, mass and lump, neck: Secondary | ICD-10-CM

## 2014-07-06 NOTE — Telephone Encounter (Signed)
Copy & Pasted to Result note.

## 2014-07-06 NOTE — Telephone Encounter (Signed)
Relation to HA:LPFX Call back number: [phone]   Reason for call:  Pt returning your call regarding lab results.

## 2014-07-07 ENCOUNTER — Ambulatory Visit (HOSPITAL_BASED_OUTPATIENT_CLINIC_OR_DEPARTMENT_OTHER)
Admission: RE | Admit: 2014-07-07 | Discharge: 2014-07-07 | Disposition: A | Discharge status: Home / Self Care | Payer: 59 | Source: Ambulatory Visit | Attending: Physician Assistant | Admitting: Physician Assistant

## 2014-07-07 DIAGNOSIS — R221 Localized swelling, mass and lump, neck: Secondary | ICD-10-CM | POA: Insufficient documentation

## 2014-11-22 ENCOUNTER — Other Ambulatory Visit: Payer: Self-pay | Admitting: Family

## 2014-11-22 DIAGNOSIS — Z1231 Encounter for screening mammogram for malignant neoplasm of breast: Secondary | ICD-10-CM

## 2014-11-29 ENCOUNTER — Encounter: Payer: 59 | Admitting: Family

## 2014-11-30 ENCOUNTER — Ambulatory Visit (HOSPITAL_BASED_OUTPATIENT_CLINIC_OR_DEPARTMENT_OTHER)
Admission: RE | Admit: 2014-11-30 | Discharge: 2014-11-30 | Disposition: A | Discharge status: Home / Self Care | Payer: 59 | Source: Ambulatory Visit | Attending: Family | Admitting: Family

## 2014-11-30 DIAGNOSIS — Z1231 Encounter for screening mammogram for malignant neoplasm of breast: Secondary | ICD-10-CM | POA: Insufficient documentation

## 2014-11-30 DIAGNOSIS — R928 Other abnormal and inconclusive findings on diagnostic imaging of breast: Secondary | ICD-10-CM | POA: Insufficient documentation

## 2014-12-01 ENCOUNTER — Other Ambulatory Visit: Payer: Self-pay | Admitting: Family

## 2014-12-01 DIAGNOSIS — R928 Other abnormal and inconclusive findings on diagnostic imaging of breast: Secondary | ICD-10-CM

## 2014-12-08 ENCOUNTER — Ambulatory Visit
Admission: RE | Admit: 2014-12-08 | Discharge: 2014-12-08 | Disposition: A | Discharge status: Home / Self Care | Payer: 59 | Source: Ambulatory Visit | Attending: Family | Admitting: Family

## 2014-12-08 DIAGNOSIS — R928 Other abnormal and inconclusive findings on diagnostic imaging of breast: Secondary | ICD-10-CM

## 2016-01-18 ENCOUNTER — Other Ambulatory Visit: Payer: Self-pay | Admitting: Family

## 2016-01-18 DIAGNOSIS — Z1231 Encounter for screening mammogram for malignant neoplasm of breast: Secondary | ICD-10-CM

## 2016-02-10 ENCOUNTER — Ambulatory Visit
Admission: RE | Admit: 2016-02-10 | Discharge: 2016-02-10 | Disposition: A | Discharge status: Home / Self Care | Payer: Self-pay | Source: Ambulatory Visit | Attending: Family | Admitting: Family

## 2016-02-10 DIAGNOSIS — Z1231 Encounter for screening mammogram for malignant neoplasm of breast: Secondary | ICD-10-CM | POA: Diagnosis not present

## 2016-10-02 ENCOUNTER — Other Ambulatory Visit: Payer: Self-pay | Admitting: Family

## 2016-10-02 ENCOUNTER — Encounter: Payer: Self-pay | Admitting: Family

## 2016-10-02 ENCOUNTER — Ambulatory Visit (HOSPITAL_BASED_OUTPATIENT_CLINIC_OR_DEPARTMENT_OTHER)
Admission: RE | Admit: 2016-10-02 | Discharge: 2016-10-02 | Disposition: A | Discharge status: Home / Self Care | Payer: 59 | Source: Ambulatory Visit | Attending: Family | Admitting: Family

## 2016-10-02 ENCOUNTER — Ambulatory Visit (INDEPENDENT_AMBULATORY_CARE_PROVIDER_SITE_OTHER): Payer: 59 | Admitting: Family

## 2016-10-02 VITALS — BP 148/78 | HR 65 | Temp 98.5°F | Resp 16 | Ht 67.5 in | Wt 172.2 lb

## 2016-10-02 DIAGNOSIS — M8580 Other specified disorders of bone density and structure, unspecified site: Secondary | ICD-10-CM | POA: Diagnosis not present

## 2016-10-02 DIAGNOSIS — Z780 Asymptomatic menopausal state: Secondary | ICD-10-CM | POA: Diagnosis not present

## 2016-10-02 DIAGNOSIS — M85852 Other specified disorders of bone density and structure, left thigh: Secondary | ICD-10-CM | POA: Diagnosis not present

## 2016-10-02 DIAGNOSIS — I10 Essential (primary) hypertension: Secondary | ICD-10-CM

## 2016-10-02 DIAGNOSIS — Z23 Encounter for immunization: Secondary | ICD-10-CM

## 2016-10-02 DIAGNOSIS — Z13820 Encounter for screening for osteoporosis: Secondary | ICD-10-CM | POA: Diagnosis not present

## 2016-10-02 DIAGNOSIS — Z0000 Encounter for general adult medical examination without abnormal findings: Secondary | ICD-10-CM

## 2016-10-02 LAB — CBC WITH DIFFERENTIAL/PLATELET
Basophils Absolute: 0 10*3/uL (ref 0.0–0.1)
Basophils Relative: 0.7 % (ref 0.0–3.0)
EOS PCT: 2.7 % (ref 0.0–5.0)
Eosinophils Absolute: 0.1 10*3/uL (ref 0.0–0.7)
HCT: 40.3 % (ref 36.0–46.0)
Hemoglobin: 13.3 g/dL (ref 12.0–15.0)
LYMPHS ABS: 1.6 10*3/uL (ref 0.7–4.0)
Lymphocytes Relative: 31.5 % (ref 12.0–46.0)
MCHC: 33 g/dL (ref 30.0–36.0)
MCV: 91.1 fl (ref 78.0–100.0)
MONOS PCT: 6.2 % (ref 3.0–12.0)
Monocytes Absolute: 0.3 10*3/uL (ref 0.1–1.0)
NEUTROS ABS: 2.9 10*3/uL (ref 1.4–7.7)
NEUTROS PCT: 58.9 % (ref 43.0–77.0)
PLATELETS: 261 10*3/uL (ref 150.0–400.0)
RBC: 4.42 Mil/uL (ref 3.87–5.11)
RDW: 13.6 % (ref 11.5–15.5)
WBC: 5 10*3/uL (ref 4.0–10.5)

## 2016-10-02 LAB — LIPID PANEL
CHOLESTEROL: 218 mg/dL — AB (ref 0–200)
HDL: 91 mg/dL (ref >39.00)
LDL Cholesterol: 114 mg/dL — ABNORMAL HIGH (ref 0–99)
NonHDL: 127.21
Total CHOL/HDL Ratio: 2
Triglycerides: 68 mg/dL (ref 0.0–149.0)
VLDL: 13.6 mg/dL (ref 0.0–40.0)

## 2016-10-02 LAB — BASIC METABOLIC PANEL
BUN: 16 mg/dL (ref 6–23)
CO2: 30 meq/L (ref 19–32)
Calcium: 9.8 mg/dL (ref 8.4–10.5)
Chloride: 102 mEq/L (ref 96–112)
Creatinine, Ser: 0.71 mg/dL (ref 0.40–1.20)
GFR: 90.5 mL/min (ref >60.00)
GLUCOSE: 91 mg/dL (ref 70–99)
POTASSIUM: 4.6 meq/L (ref 3.5–5.1)
SODIUM: 138 meq/L (ref 135–145)

## 2016-10-02 LAB — URINALYSIS, ROUTINE W REFLEX MICROSCOPIC
BILIRUBIN URINE: NEGATIVE
Hgb urine dipstick: NEGATIVE
KETONES UR: NEGATIVE
Leukocytes, UA: NEGATIVE
NITRITE: NEGATIVE
PH: 7 (ref 5.0–8.0)
Specific Gravity, Urine: 1.02 (ref 1.000–1.030)
Total Protein, Urine: NEGATIVE
Urine Glucose: NEGATIVE
Urobilinogen, UA: 0.2 (ref 0.0–1.0)

## 2016-10-02 LAB — HEPATIC FUNCTION PANEL
ALBUMIN: 4.2 g/dL (ref 3.5–5.2)
ALT: 15 U/L (ref 0–35)
AST: 18 U/L (ref 0–37)
Alkaline Phosphatase: 67 U/L (ref 39–117)
Bilirubin, Direct: 0.1 mg/dL (ref 0.0–0.3)
Total Bilirubin: 0.5 mg/dL (ref 0.2–1.2)
Total Protein: 7.3 g/dL (ref 6.0–8.3)

## 2016-10-02 LAB — TSH: TSH: 1.77 u[IU]/mL (ref 0.35–4.50)

## 2016-10-02 MED ORDER — AMLODIPINE BESYLATE 5 MG PO TABS: 5.0000 mg | ORAL_TABLET | Freq: Every day | ORAL | 3 refills | Status: DC

## 2016-10-02 MED FILL — AMLODIPINE BESYLATE 5 MG TA: 5 | 30 days supply | Qty: 30 | Fill #0

## 2016-10-02 NOTE — Addendum Note (Signed)
Addended by: Mervin Kung A on: 10/02/2016 02:52 PM   Modules accepted: Orders

## 2016-10-02 NOTE — Assessment & Plan Note (Signed)
BP has been consistently above goal. Add amlodipine once daily.

## 2016-10-02 NOTE — Patient Instructions (Signed)
Please complete lab work prior to leaving. Schedule bone density on the first floor in imaging. Add amlodipine  once daily for blood pressure. Continue to work on [employer], exercise and weight loss.

## 2016-10-02 NOTE — Progress Notes (Signed)
Subjective:    Patient ID: Dominique Rice, female    DOB: 11-Sep-1960, 56 y.o.   MRN: [id number]  HPI   Dominique Rice is a 56 yr old female who presents today for complete physical.    Patient presents today for complete physical.  Immunizations: will get flu shot at work.  Tetanus is due Diet: reports healthy diet Exercise: walks Colonoscopy: 2014- 10 year follow up Dexa: 2014 Pap Smear: Hysterectomy Mammogram: 02/10/16  Elevated blood pressure-  BP Readings from Last 3 Encounters:  10/02/16 (!) 147/76  07/02/14 (!) 144/76  03/17/14 (!) 159/82   Wt Readings from Last 3 Encounters:  10/02/16 172 lb 3.2 oz (78.1 kg)  07/02/14 152 lb 8 oz (69.2 kg)  03/17/14 152 lb 8 oz (69.2 kg)       Review of Systems  Constitutional: Positive for unexpected weight change.  HENT: Negative for hearing loss and rhinorrhea.   Eyes: Negative for visual disturbance.  Respiratory: Negative for cough.   Cardiovascular: Negative for leg swelling.  Gastrointestinal: Negative for blood in stool, constipation and diarrhea.  Genitourinary: Negative for dysuria, frequency and hematuria.  Musculoskeletal: Negative for arthralgias and myalgias.  Skin: Negative for rash.  Neurological: Negative for headaches.  Hematological: Negative for adenopathy.  Psychiatric/Behavioral:       Denies depression/anxiety   Past Medical History:  Diagnosis Date  . Pneumonia      Social History   Social History  . Marital status: Married    Spouse name: N/A  . Number of children: N/A  . Years of education: N/A   Occupational History  . Not on file.   Social History Main Topics  . Smoking status: Never Smoker  . Smokeless tobacco: Never Used  . Alcohol use 1.8 oz/week    3 Glasses of wine per week  . Drug use: No  . Sexual activity: Not on file   Other Topics Concern  . 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 ABDOMINAL HYSTERECTOMY    . CESAREAN SECTION     twice  . ENDOMETRIAL ABLATION  2005  . OOPHORECTOMY Left 1998  . TUBAL LIGATION  1998    Family History  Problem Relation Age of Onset  . Cancer Mother 32       colon 2010  . Diabetes Mother        type II  . Colon cancer Mother   . Macular degeneration Mother   . Other Mother        trigeminal neuralgia  . Atrial fibrillation Mother   . Coronary artery disease Father        23 had CABG  . Atrial fibrillation Father   . Hodgkin's lymphoma Son   . Esophageal cancer Neg Hx   . Rectal cancer Neg Hx   . Stomach cancer Neg Hx     Allergies  Allergen Reactions  . Shrimp [Shellfish Allergy] Nausea And Vomiting    09/29/12  Pt states she can tolerate betadine and IV dyes. Tf,cma(aama)    Current Outpatient Prescriptions on File Prior to Visit  Medication Sig Dispense Refill  . Calcium-Magnesium-Vitamin D (CALCIUM 500 PO) Take 1 tablet by mouth 2 (  two) times daily.    . Multiple Vitamin (MULTIVITAMIN) tablet Take 1 tablet by mouth daily.     No current facility-administered medications on file prior to visit.     BP (!) 147/76 (BP Location: Right Arm, Cuff Size: Normal)   Pulse 65   Temp 98.5 F (36.9 C) (Oral)   Resp 16   Ht 5' 7.5" (1.715 m)   Wt 172 lb 3.2 oz (78.1 kg)   SpO2 100%   BMI 26.57 kg/m       Objective:   Physical Exam Physical Exam  Constitutional: She is oriented to person, place, and time. She appears well-developed and well-nourished. No distress.  HENT:  Head: Normocephalic and atraumatic.  Right Ear: Tympanic membrane and ear canal normal.  Left Ear: Tympanic membrane and ear canal normal.  Mouth/Throat: Oropharynx is clear and moist.  Eyes: Pupils are equal, round, and reactive to light. No scleral icterus.  Neck: Normal range of motion. No thyromegaly present.    Cardiovascular: Normal rate and regular rhythm.   No murmur heard. Pulmonary/Chest: Effort normal and breath sounds normal. No respiratory distress. He has no wheezes. She has no rales. She exhibits no tenderness.  Abdominal: Soft. Bowel sounds are normal. She exhibits no distension and no mass. There is no tenderness. There is no rebound and no guarding.  Musculoskeletal: She exhibits no edema.  Lymphadenopathy:    She has no cervical adenopathy.  Neurological: She is alert and oriented to person, place, and time. She has normal patellar reflexes. She exhibits normal muscle tone. Coordination normal.  Skin: Skin is warm and dry.  Psychiatric: She has a normal mood and affect. Her behavior is normal. Judgment and thought content normal.  Breasts: Examined lying Right: Without masses, retractions, discharge or axillary adenopathy.  Left: Without masses, retractions, discharge or axillary adenopathy.  Pelvic: deferred         Assessment & Plan:          Assessment & Plan:  Preventative care- Td today. Refer for dexa, obtain routine lab work. Discussed healthy diet, exercise and weight loss.

## 2016-10-29 ENCOUNTER — Ambulatory Visit (INDEPENDENT_AMBULATORY_CARE_PROVIDER_SITE_OTHER): Payer: 59 | Admitting: Family

## 2016-10-29 ENCOUNTER — Encounter: Payer: Self-pay | Admitting: Family

## 2016-10-29 VITALS — BP 133/73 | HR 65 | Temp 97.9°F | Resp 16 | Ht 67.5 in | Wt 168.6 lb

## 2016-10-29 DIAGNOSIS — I10 Essential (primary) hypertension: Secondary | ICD-10-CM

## 2016-10-29 MED FILL — AMLODIPINE BESYLATE 5 MG TA: 5 | 30 days supply | Qty: 30 | Fill #1

## 2016-10-29 NOTE — Progress Notes (Signed)
Subjective:    Patient ID: Dominique Rice, female    DOB: 15-Feb-1960, 56 y.o.   MRN: [id number]  HPI   Dominique Rice is a 56 yr old female who presents today for follow up of her blood pressure. Last visit BP was noted to be elevated and we added amlodipine 5 mg once daily. She reports tolerating without difficulty. Denies LE edema. Reports BP has been well controlled at home.   Review of Systems See HPI  Past Medical History:  Diagnosis Date  . Osteopenia   . Pneumonia      Social History   Social History  . Marital status: Married    Spouse name: N/A  . Number of children: N/A  . Years of education: N/A   Occupational History  . Not on file.   Social History Main Topics  . Smoking status: Never Smoker  . Smokeless tobacco: Never Used  . Alcohol use 1.8 oz/week    3 Glasses of wine per week  . Drug use: No  . Sexual activity: Not on file   Other Topics Concern  . 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 ABDOMINAL HYSTERECTOMY    . CESAREAN SECTION     twice  . ENDOMETRIAL ABLATION  2005  . OOPHORECTOMY Left 1998  . TUBAL LIGATION  1998    Family History  Problem Relation Age of Onset  . Cancer Mother 92       colon 2010  . Diabetes Mother        type II  . Colon cancer Mother   . Macular degeneration Mother   . Other Mother        trigeminal neuralgia  . Atrial fibrillation Mother   . Coronary artery disease Father        22 had CABG  . Atrial fibrillation Father   . Hodgkin's lymphoma Son   . Esophageal cancer Neg Hx   . Rectal cancer Neg Hx   . Stomach cancer Neg Hx     Allergies  Allergen Reactions  . Shrimp [Shellfish Allergy] Nausea And Vomiting    09/29/12  Pt states she can tolerate betadine and IV dyes. Tf,cma(aama)     Current Outpatient Prescriptions on File Prior to Visit  Medication Sig Dispense Refill  . amLODipine (NORVASC) 5 MG tablet Take 1 tablet (5 mg total) by mouth daily. 30 tablet 3  . Calcium-Magnesium-Vitamin D (CALCIUM 500 PO) Take 1 tablet by mouth 2 (two) times daily.    . Multiple Vitamin (MULTIVITAMIN) tablet Take 1 tablet by mouth daily.     No current facility-administered medications on file prior to visit.     BP 133/73 (BP Location: Right Arm, Cuff Size: Normal)   Pulse 65   Temp 97.9 F (36.6 C) (Oral)   Resp 16   Ht 5' 7.5" (1.715 m)   Wt 168 lb 9.6 oz (76.5 kg)   SpO2 99%   BMI 26.02 kg/m       Objective:   Physical Exam  Constitutional: She is oriented to person, place, and time. She appears well-developed and well-nourished.  Cardiovascular: Normal rate, regular rhythm and normal heart sounds.   No  murmur heard. Pulmonary/Chest: Effort normal and breath sounds normal. No respiratory distress. She has no wheezes.  Musculoskeletal: She exhibits no edema.  Neurological: She is alert and oriented to person, place, and time.  Psychiatric: She has a normal mood and affect. Her behavior is normal. Judgment and thought content normal.          Assessment & Plan:  HTN- improved. Continue amlodipine.  BP Readings from Last 3 Encounters:  10/29/16 133/73  10/02/16 (!) 148/78  07/02/14 (!) 144/76   EKG tracing is personally reviewed.  EKG notes NSR.  No acute changes.

## 2016-11-07 DIAGNOSIS — L820 Inflamed seborrheic keratosis: Secondary | ICD-10-CM | POA: Diagnosis not present

## 2016-11-07 DIAGNOSIS — Z23 Encounter for immunization: Secondary | ICD-10-CM | POA: Diagnosis not present

## 2016-11-07 DIAGNOSIS — L309 Dermatitis, unspecified: Secondary | ICD-10-CM | POA: Diagnosis not present

## 2016-11-07 MED FILL — KETOCONAZOLE 2% SHAMPOO: 2 | 30 days supply | Qty: 120 | Fill #0

## 2016-11-07 MED FILL — CLOBETASOL 0.05% SOLUTION: 0.05 | 15 days supply | Qty: 50 | Fill #0

## 2016-11-27 MED FILL — AMLODIPINE BESYLATE 5 MG TA: 5 | 30 days supply | Qty: 30 | Fill #2

## 2016-12-19 DIAGNOSIS — L309 Dermatitis, unspecified: Secondary | ICD-10-CM | POA: Diagnosis not present

## 2016-12-27 MED FILL — AMLODIPINE BESYLATE 5 MG TA: 5 | 30 days supply | Qty: 30 | Fill #3

## 2017-01-28 ENCOUNTER — Other Ambulatory Visit: Payer: Self-pay | Admitting: Family

## 2017-01-28 MED FILL — AMLODIPINE BESYLATE 5 MG TA: 5 | 90 days supply | Qty: 90 | Fill #0

## 2017-04-17 ENCOUNTER — Other Ambulatory Visit: Payer: Self-pay | Admitting: Family

## 2017-04-17 DIAGNOSIS — Z1231 Encounter for screening mammogram for malignant neoplasm of breast: Secondary | ICD-10-CM

## 2017-04-22 DIAGNOSIS — M25532 Pain in left wrist: Secondary | ICD-10-CM | POA: Diagnosis not present

## 2017-04-22 DIAGNOSIS — M654 Radial styloid tenosynovitis [de Quervain]: Secondary | ICD-10-CM | POA: Diagnosis not present

## 2017-05-06 ENCOUNTER — Ambulatory Visit: Payer: 59 | Admitting: Family

## 2017-05-06 MED FILL — AMLODIPINE BESYLATE 5 MG TA: 5 | 30 days supply | Qty: 30 | Fill #1

## 2017-05-09 ENCOUNTER — Ambulatory Visit: Payer: 59

## 2017-05-09 MED FILL — KETOCONAZOLE 2% SHAMPOO: 2 | 30 days supply | Qty: 120 | Fill #1

## 2017-05-13 ENCOUNTER — Encounter: Payer: Self-pay | Admitting: Family

## 2017-05-13 ENCOUNTER — Ambulatory Visit: Payer: 59 | Admitting: Family

## 2017-05-13 VITALS — BP 118/77 | HR 64 | Temp 98.3°F | Resp 16 | Ht 67.5 in | Wt 175.8 lb

## 2017-05-13 DIAGNOSIS — I10 Essential (primary) hypertension: Secondary | ICD-10-CM | POA: Diagnosis not present

## 2017-05-13 LAB — BASIC METABOLIC PANEL
BUN: 14 mg/dL (ref 6–23)
CHLORIDE: 105 meq/L (ref 96–112)
CO2: 30 meq/L (ref 19–32)
Calcium: 9 mg/dL (ref 8.4–10.5)
Creatinine, Ser: 0.6 mg/dL (ref 0.40–1.20)
GFR: 109.67 mL/min (ref >60.00)
GLUCOSE: 95 mg/dL (ref 70–99)
POTASSIUM: 4.8 meq/L (ref 3.5–5.1)
Sodium: 141 mEq/L (ref 135–145)

## 2017-05-13 MED ORDER — AMLODIPINE BESYLATE 5 MG PO TABS: 5.0000 mg | ORAL_TABLET | Freq: Every day | ORAL | 1 refills | Status: DC

## 2017-05-13 MED FILL — CLOBETASOL 0.05% SOLUTION: 0.05 | 15 days supply | Qty: 50 | Fill #0

## 2017-05-13 NOTE — Patient Instructions (Signed)
Please complete lab work prior to leaving.   

## 2017-05-13 NOTE — Progress Notes (Signed)
Subjective:    Patient ID: Dominique Rice, female    DOB: [birth year], 57 y.o.   MRN: [id number]  HPI  Dominique Rice is a 57 yr old female who presents today for routine follow up of her hypertension.  She is maintained on amlodipine  once daily. Denies cp/sob or swelling.  BP Readings from Last 3 Encounters:  05/13/17 118/77  10/29/16 133/73  10/02/16 (!) 148/78    Review of Systems See HPI  Past Medical History:  Diagnosis Date  . Osteopenia   . Pneumonia      Social History   Socioeconomic History  . Marital status: Married    Spouse name: Not on file  . Number of children: Not on file  . Years of education: Not on file  . Highest education level: Not on file  Occupational History  . Not on file  Social Needs  . Financial resource strain: Not on file  . Food insecurity:    Worry: Not on file    Inability: Not on file  . Transportation needs:    Medical: Not on file    Non-medical: Not on file  Tobacco Use  . Smoking status: Never Smoker  . Smokeless tobacco: Never Used  Substance and Sexual Activity  . Alcohol use: Yes    Alcohol/week: 1.8 oz    Types: 3 Glasses of wine per week  . Drug use: No  . Sexual activity: Not on file  Lifestyle  . Physical activity:    Days per week: Not on file    Minutes per session: Not on file  . Stress: Not on file  Relationships  . Social connections:    Talks on phone: Not on file    Gets together: Not on file    Attends religious service: Not on file    Active member of club or organization: Not on file    Attends meetings of clubs or organizations: Not on file    Relationship status: Not on file  . Intimate partner violence:    Fear of current or ex partner: Not on file    Emotionally abused: Not on file    Physically abused: Not on file    Forced sexual activity: Not on file  Other Topics Concern  . 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 ABDOMINAL HYSTERECTOMY    . CESAREAN SECTION     twice  . ENDOMETRIAL ABLATION  2005  . OOPHORECTOMY Left 1998  . TUBAL LIGATION  1998    Family History  Problem Relation Age of Onset  . Cancer Mother 55       colon 2010  . Diabetes Mother        type II  . Colon cancer Mother   . Macular degeneration Mother   . Other Mother        trigeminal neuralgia  . Atrial fibrillation Mother   . Coronary artery disease Father        76 had CABG  . Atrial fibrillation Father   . Hodgkin's lymphoma Son   . Esophageal cancer Neg Hx   . Rectal cancer Neg Hx   . Stomach cancer Neg Hx     Allergies  Allergen Reactions  . Shrimp [Shellfish Allergy] Nausea And Vomiting    09/29/12  Pt states she can tolerate betadine and IV dyes. Tf,cma(aama)    Current Outpatient Medications on File Prior to Visit  Medication Sig Dispense Refill  . amLODipine (NORVASC) 5 MG tablet TAKE 1 TABLET BY MOUTH DAILY 30 tablet 3  . Calcium-Magnesium-Vitamin D (CALCIUM 500 PO) Take 1 tablet by mouth 2 (two) times daily.    . Multiple Vitamin (MULTIVITAMIN) tablet Take 1 tablet by mouth daily.     No current facility-administered medications on file prior to visit.     BP 118/77 (BP Location: Right Arm, Cuff Size: Normal)   Pulse 64   Temp 98.3 F (36.8 C) (Oral)   Resp 16   Ht 5' 7.5" (1.715 m)   Wt 175 lb 12.8 oz (79.7 kg)   SpO2 100%   BMI 27.13 kg/m       Objective:   Physical Exam  Constitutional: She is oriented to person, place, and time. She appears well-developed and well-nourished.  Cardiovascular: Normal rate, regular rhythm and normal heart sounds.  No murmur heard. Pulmonary/Chest: Effort normal and breath sounds normal. No respiratory distress. She has no wheezes.  Musculoskeletal: She exhibits no edema.  Neurological: She is alert  and oriented to person, place, and time.  Psychiatric: She has a normal mood and affect. Her behavior is normal. Judgment and thought content normal.          Assessment & Plan:  Hypertension- stable on amlodipine. Continue same. Obtain follow up bmet.

## 2017-05-22 ENCOUNTER — Ambulatory Visit
Admission: RE | Admit: 2017-05-22 | Discharge: 2017-05-22 | Disposition: A | Discharge status: Home / Self Care | Payer: 59 | Source: Ambulatory Visit | Attending: Family | Admitting: Family

## 2017-05-22 DIAGNOSIS — Z1231 Encounter for screening mammogram for malignant neoplasm of breast: Secondary | ICD-10-CM | POA: Diagnosis not present

## 2017-06-05 MED FILL — AMLODIPINE BESYLATE 5 MG TA: 5 | 90 days supply | Qty: 90 | Fill #0

## 2017-09-11 MED FILL — AMLODIPINE BESYLATE 5 MG TA: 5 | 90 days supply | Qty: 90 | Fill #1

## 2017-11-13 ENCOUNTER — Encounter: Payer: Self-pay | Admitting: Family

## 2017-11-13 ENCOUNTER — Ambulatory Visit (INDEPENDENT_AMBULATORY_CARE_PROVIDER_SITE_OTHER): Payer: 59 | Admitting: Family

## 2017-11-13 VITALS — BP 129/72 | HR 59 | Temp 98.6°F | Resp 16 | Ht 68.0 in | Wt 174.0 lb

## 2017-11-13 DIAGNOSIS — Z23 Encounter for immunization: Secondary | ICD-10-CM

## 2017-11-13 DIAGNOSIS — Z0000 Encounter for general adult medical examination without abnormal findings: Secondary | ICD-10-CM

## 2017-11-13 LAB — LIPID PANEL
Cholesterol: 221 mg/dL — ABNORMAL HIGH (ref 0–200)
HDL: 74.6 mg/dL (ref >39.00)
LDL Cholesterol: 131 mg/dL — ABNORMAL HIGH (ref 0–99)
NonHDL: 146.29
Total CHOL/HDL Ratio: 3
Triglycerides: 76 mg/dL (ref 0.0–149.0)
VLDL: 15.2 mg/dL (ref 0.0–40.0)

## 2017-11-13 LAB — CBC WITH DIFFERENTIAL/PLATELET
Basophils Absolute: 0 10*3/uL (ref 0.0–0.1)
Basophils Relative: 0.5 % (ref 0.0–3.0)
EOS ABS: 0.1 10*3/uL (ref 0.0–0.7)
Eosinophils Relative: 0.9 % (ref 0.0–5.0)
HCT: 40.6 % (ref 36.0–46.0)
Hemoglobin: 13.6 g/dL (ref 12.0–15.0)
LYMPHS PCT: 34.6 % (ref 12.0–46.0)
Lymphs Abs: 1.9 10*3/uL (ref 0.7–4.0)
MCHC: 33.5 g/dL (ref 30.0–36.0)
MCV: 89.2 fl (ref 78.0–100.0)
Monocytes Absolute: 0.4 10*3/uL (ref 0.1–1.0)
Monocytes Relative: 7.6 % (ref 3.0–12.0)
NEUTROS PCT: 56.4 % (ref 43.0–77.0)
Neutro Abs: 3.1 10*3/uL (ref 1.4–7.7)
PLATELETS: 312 10*3/uL (ref 150.0–400.0)
RBC: 4.55 Mil/uL (ref 3.87–5.11)
RDW: 13.7 % (ref 11.5–15.5)
WBC: 5.5 10*3/uL (ref 4.0–10.5)

## 2017-11-13 LAB — URINALYSIS, ROUTINE W REFLEX MICROSCOPIC
BILIRUBIN URINE: NEGATIVE
HGB URINE DIPSTICK: NEGATIVE
KETONES UR: NEGATIVE
LEUKOCYTES UA: NEGATIVE
NITRITE: NEGATIVE
PH: 7 (ref 5.0–8.0)
RBC / HPF: NONE SEEN (ref 0–?)
Specific Gravity, Urine: 1.01 (ref 1.000–1.030)
Total Protein, Urine: NEGATIVE
UROBILINOGEN UA: 0.2 (ref 0.0–1.0)
Urine Glucose: NEGATIVE

## 2017-11-13 LAB — BASIC METABOLIC PANEL WITH GFR
BUN: 11 mg/dL (ref 6–23)
CO2: 32 meq/L (ref 19–32)
Calcium: 9.7 mg/dL (ref 8.4–10.5)
Chloride: 102 meq/L (ref 96–112)
Creatinine, Ser: 0.73 mg/dL (ref 0.40–1.20)
GFR: 87.3 mL/min
Glucose, Bld: 87 mg/dL (ref 70–99)
Potassium: 5 meq/L (ref 3.5–5.1)
Sodium: 140 meq/L (ref 135–145)

## 2017-11-13 LAB — HEPATIC FUNCTION PANEL
ALT: 16 U/L (ref 0–35)
AST: 20 U/L (ref 0–37)
Albumin: 4.5 g/dL (ref 3.5–5.2)
Alkaline Phosphatase: 74 U/L (ref 39–117)
Bilirubin, Direct: 0.1 mg/dL (ref 0.0–0.3)
Total Bilirubin: 0.6 mg/dL (ref 0.2–1.2)
Total Protein: 7.4 g/dL (ref 6.0–8.3)

## 2017-11-13 LAB — TSH: TSH: 1.48 u[IU]/mL (ref 0.35–4.50)

## 2017-11-13 NOTE — Patient Instructions (Signed)
Please complete lab work prior to leaving.  Continue healthy diet and regular exercise.  

## 2017-11-13 NOTE — Progress Notes (Signed)
Subjective:    Patient ID: Dominique Rice, female    DOB: February 13, 1960, 57 y.o.   MRN: [id number]  HPI  Patient presents today for complete physical.  Immunizations: would like shingrix today. Flu and tetanus up to date.  Diet:  healthy Exercise:  Walking 3 days a week for 45 minutes Colonoscopy: 2014 Dexa: 2018 osteopenia Pap Smear:  hysterectomy Mammogram: 05/23/17 Dental: up to date Vision: up to date  Wt Readings from Last 3 Encounters:  11/13/17 174 lb (78.9 kg)  05/13/17 175 lb 12.8 oz (79.7 kg)  10/29/16 168 lb 9.6 oz (76.5 kg)         Review of Systems  Constitutional: Negative for unexpected weight change.  HENT: Negative for hearing loss and rhinorrhea.   Eyes: Negative for visual disturbance.  Respiratory: Positive for cough.   Cardiovascular: Negative for leg swelling.  Gastrointestinal: Negative for constipation and diarrhea.  Genitourinary: Negative for dysuria, frequency and hematuria.  Musculoskeletal: Negative for arthralgias and myalgias.  Skin: Negative for rash.  Neurological: Negative for headaches.  Hematological: Negative for adenopathy.  Psychiatric/Behavioral: Negative for sleep disturbance.       Denies depression/anxiety       Past Medical History:  Diagnosis Date  . Osteopenia   . Pneumonia      Social History   Socioeconomic History  . Marital status: Married    Spouse name: Not on file  . Number of children: Not on file  . Years of education: Not on file  . Highest education level: Not on file  Occupational History  . Not on file  Social Needs  . Financial resource strain: Not on file  . Food insecurity:    Worry: Not on file    Inability: Not on file  . Transportation needs:    Medical: Not on file    Non-medical: Not on file  Tobacco Use  . Smoking status: Never Smoker  . Smokeless tobacco: Never Used  Substance and Sexual Activity  . Alcohol use: Yes    Alcohol/week: 3.0 standard drinks    Types: 3 Glasses of  wine per week  . Drug use: No  . Sexual activity: Not on file  Lifestyle  . Physical activity:    Days per week: Not on file    Minutes per session: Not on file  . Stress: Not on file  Relationships  . Social connections:    Talks on phone: Not on file    Gets together: Not on file    Attends religious service: Not on file    Active member of club or organization: Not on file    Attends meetings of clubs or organizations: Not on file    Relationship status: Not on file  . Intimate partner violence:    Fear of current or ex partner: Not on file    Emotionally abused: Not on file    Physically abused: Not on file    Forced sexual activity: Not on file  Other Topics Concern  . 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 ABDOMINAL HYSTERECTOMY    . CESAREAN SECTION     twice  . ENDOMETRIAL ABLATION  2005  . OOPHORECTOMY Left 1998  .  TUBAL LIGATION  1998    Family History  Problem Relation Age of Onset  . Cancer Mother 55       colon 2010  . Diabetes Mother        type II  . Colon cancer Mother   . Macular degeneration Mother   . Other Mother        trigeminal neuralgia  . Atrial fibrillation Mother   . Coronary artery disease Father        74 had CABG  . Atrial fibrillation Father   . Hodgkin's lymphoma Son   . Esophageal cancer Neg Hx   . Rectal cancer Neg Hx   . Stomach cancer Neg Hx     Allergies  Allergen Reactions  . Shrimp [Shellfish Allergy] Nausea And Vomiting    09/29/12  Pt states she can tolerate betadine and IV dyes. Tf,cma(aama)    Current Outpatient Medications on File Prior to Visit  Medication Sig Dispense Refill  . amLODipine (NORVASC) 5 MG tablet Take 1 tablet (5 mg total) by mouth daily. 90 tablet 1  . Calcium-Magnesium-Vitamin D  (CALCIUM 500 PO) Take 1 tablet by mouth 2 (two) times daily.    . clobetasol (TEMOVATE) 0.05 % external solution Apply 1 application topically 2 (two) times daily.    Marland Kitchen ketoconazole (NIZORAL) 2 % shampoo Apply 1 application topically 2 (two) times a week.    . Multiple Vitamin (MULTIVITAMIN) tablet Take 1 tablet by mouth daily.     No current facility-administered medications on file prior to visit.     BP 129/72 (BP Location: Right Arm, Patient Position: Sitting, Cuff Size: Small)   Pulse (!) 59   Temp 98.6 F (37 C) (Oral)   Resp 16   Ht 5\' 8"  (1.727 m)   Wt 174 lb (78.9 kg)   SpO2 100%   BMI 26.46 kg/m    Objective:   Physical Exam  Physical Exam  Constitutional: She is oriented to person, place, and time. She appears well-developed and well-nourished. No distress.  HENT:  Head: Normocephalic and atraumatic.  Right Ear: Tympanic membrane and ear canal normal.  Left Ear: Tympanic membrane and ear canal normal.  Mouth/Throat: Oropharynx is clear and moist.  Eyes: Pupils are equal, round, and reactive to light. No scleral icterus.  Neck: Normal range of motion. No thyromegaly present.  Cardiovascular: Normal rate and regular rhythm.   No murmur heard. Pulmonary/Chest: Effort normal and breath sounds normal. No respiratory distress. He has no wheezes. She has no rales. She exhibits no tenderness.  Abdominal: Soft. Bowel sounds are normal. She exhibits no distension and no mass. There is no tenderness. There is no rebound and no guarding.  Musculoskeletal: She exhibits no edema.  Lymphadenopathy:    She has no cervical adenopathy.  Neurological: She is alert and oriented to person, place, and time. She has normal patellar reflexes. She exhibits normal muscle tone. Coordination normal.  Skin: Skin is warm and dry.  Psychiatric: She has a normal mood and affect. Her behavior is normal. Judgment and thought content normal.  Breasts: Examined lying Right: Without masses,  retractions, discharge or axillary adenopathy.  Left: Without masses, retractions, discharge or axillary adenopathy. Pelvic: deferred          Assessment & Plan:   Preventative care- mammo up to date. Will check with GI if he wants to repeat colon in 5 years due to family hx. Obtain routine lab work. Tetanus/flu/dexa up to date.  Assessment & Plan:  EKG tracing is personally reviewed.  EKG notes NSR.  No acute changes.

## 2017-12-02 ENCOUNTER — Encounter: Payer: Self-pay | Admitting: Internal Medicine

## 2017-12-02 NOTE — Telephone Encounter (Signed)
-----   Message from Iva Booparl E Gessner, MD sent at 12/02/2017  7:26 AM EST ----- Regarding: FHX CRCA Hi Marchello Rothgeb,  Looks like mom was diagnosed at 7072 - so as long as no other sig #'s of people in family with colon cancer or a syndrome she should be able to wait 10 yrs from last. That also assumes no sxs/signs.  The guidelines state if first degree relative is < 60 - I usually make it 60ish but dx at 72 I do not consider significant.  Take care,  Baldo Asharl ----- Message ----- From: Sandford Craze'Sullivan, Burley Kopka, NP Sent: 11/13/2017   9:52 AM EST To: Iva Booparl E Gessner, MD  Hi,  She had negative colo 2014,  Mom had  Colon cancer.  Her chart says 10 year follow up. Do you want to repeat in 5?  Just checking.  Thanks,  General MillsMelissa

## 2017-12-09 ENCOUNTER — Other Ambulatory Visit: Payer: Self-pay | Admitting: Family

## 2017-12-09 MED FILL — AMLODIPINE BESYLATE 5 MG TA: 5 | 90 days supply | Qty: 90 | Fill #0

## 2018-02-06 ENCOUNTER — Ambulatory Visit (INDEPENDENT_AMBULATORY_CARE_PROVIDER_SITE_OTHER): Payer: 59

## 2018-02-06 DIAGNOSIS — Z23 Encounter for immunization: Secondary | ICD-10-CM | POA: Diagnosis not present

## 2018-02-06 NOTE — Progress Notes (Signed)
Patient comes in for shingrix injection #2. Injection was administered w/o any incidents. Patient left office in satisfactory condition.

## 2018-03-17 MED FILL — AMLODIPINE BESYLATE 5 MG TA: 5 | 90 days supply | Qty: 90 | Fill #1

## 2018-03-22 ENCOUNTER — Telehealth: Payer: 59 | Admitting: Physician Assistant

## 2018-03-22 DIAGNOSIS — B9689 Other specified bacterial agents as the cause of diseases classified elsewhere: Secondary | ICD-10-CM

## 2018-03-22 DIAGNOSIS — J208 Acute bronchitis due to other specified organisms: Secondary | ICD-10-CM | POA: Diagnosis not present

## 2018-03-22 MED ORDER — BENZONATATE 100 MG PO CAPS: 100.0000 mg | ORAL_CAPSULE | Freq: Three times a day (TID) | ORAL | 0 refills | Status: DC | PRN

## 2018-03-22 MED ORDER — AZITHROMYCIN 250 MG PO TABS
ORAL_TABLET | ORAL | 0 refills | Status: DC
Start: 2018-03-22 — End: 2018-05-21

## 2018-03-22 MED ORDER — AZITHROMYCIN 250 MG PO TABS: ORAL_TABLET | ORAL | 0 refills | Status: DC

## 2018-03-22 NOTE — Addendum Note (Signed)
Addended by: Waldon Merl on: 03/22/2018 10:16 AM   Modules accepted: Orders

## 2018-03-22 NOTE — Progress Notes (Signed)
We are sorry that you are not feeling well.  Here is how we plan to help!  Based on your presentation I believe you most likely have A cough due to bacteria.  When patients have a fever and a productive cough with a change in color or increased sputum production, we are concerned about bacterial bronchitis.  If left untreated it can progress to pneumonia.  If your symptoms do not improve with your treatment plan it is important that you contact your provider.   I have prescribed Azithromyin 250 mg: two tablets now and then one tablet daily for 4 additonal days    In addition you may use A prescription cough medication called Tessalon Perles 100mg . You may take 1-2 capsules every 8 hours as needed for your cough.  From your responses in the eVisit questionnaire you describe inflammation in the upper respiratory tract which is causing a significant cough.  This is commonly called Bronchitis and has four common causes:    Allergie  Viral Infections  Acid Reflux  Bacterial Infection Allergies, viruses and acid reflux are treated by controlling symptoms or eliminating the cause. An example might be a cough caused by taking certain blood pressure medications. You stop the cough by changing the medication. Another example might be a cough caused by acid reflux. Controlling the reflux helps control the cough.  USE OF BRONCHODILATOR ("RESCUE") INHALERS: There is a risk from using your bronchodilator too frequently.  The risk is that over-reliance on a medication which only relaxes the muscles surrounding the breathing tubes can reduce the effectiveness of medications prescribed to reduce swelling and congestion of the tubes themselves.  Although you feel brief relief from the bronchodilator inhaler, your asthma may actually be worsening with the tubes becoming more swollen and filled with mucus.  This can delay other crucial treatments, such as oral steroid medications. If you need to use a bronchodilator  inhaler daily, several times per day, you should discuss this with your provider.  There are probably better treatments that could be used to keep your asthma under control.     HOME CARE . Only take medications as instructed by your medical team. . Complete the entire course of an antibiotic. . Drink plenty of fluids and get plenty of rest. . Avoid close contacts especially the very young and the elderly . Cover your mouth if you cough or cough into your sleeve. . Always remember to wash your hands . A steam or ultrasonic humidifier can help congestion.   GET HELP RIGHT AWAY IF: . You develop worsening fever. . You become short of breath . You cough up blood. . Your symptoms persist after you have completed your treatment plan MAKE SURE YOU   Understand these instructions.  Will watch your condition.  Will get help right away if you are not doing well or get worse.  Your e-visit answers were reviewed by a board certified advanced clinical practitioner to complete your personal care plan.  Depending on the condition, your plan could have included both over the counter or prescription medications. If there is a problem please reply  once you have received a response from your provider. Your safety is important to [place].  If you have drug allergies check your prescription carefully.    You can use MyChart to ask questions about today's visit, request a non-urgent call back, or ask for a work or school excuse for 24 hours related to this e-Visit. If it has been greater  greater than 24 hours you will need to follow up with your provider, or enter a new e-Visit to address those concerns. You will get an e-mail in the next two days asking about your experience.  I hope that your e-visit has been valuable and will speed your recovery. Thank you for using e-visits.   

## 2018-03-22 NOTE — Progress Notes (Signed)
I have spent 5 minutes in review of e-visit questionnaire, review and updating patient chart, medical decision making and response to patient.   Dominique Rice Cody Melaina Howerton, PA-C    

## 2018-05-13 ENCOUNTER — Telehealth: Payer: Self-pay | Admitting: Family

## 2018-05-13 NOTE — Telephone Encounter (Signed)
Please contact pt to arrange a follow up visit to review her BP.

## 2018-05-14 NOTE — Telephone Encounter (Signed)
Appointment scheduled for Wednesday 6th

## 2018-05-16 ENCOUNTER — Other Ambulatory Visit: Payer: Self-pay | Admitting: Family

## 2018-05-16 DIAGNOSIS — Z1231 Encounter for screening mammogram for malignant neoplasm of breast: Secondary | ICD-10-CM

## 2018-05-21 ENCOUNTER — Ambulatory Visit (INDEPENDENT_AMBULATORY_CARE_PROVIDER_SITE_OTHER): Payer: 59 | Admitting: Family

## 2018-05-21 ENCOUNTER — Other Ambulatory Visit: Payer: Self-pay

## 2018-05-21 DIAGNOSIS — I10 Essential (primary) hypertension: Secondary | ICD-10-CM

## 2018-05-21 MED ORDER — AMLODIPINE BESYLATE 5 MG PO TABS: 5.0000 mg | ORAL_TABLET | Freq: Every day | ORAL | 1 refills | Status: DC

## 2018-05-21 NOTE — Progress Notes (Signed)
Virtual Visit via Video Note  I connected with Dominique Rice on 05/21/18 at  8:00 AM EDT by a video enabled telemedicine application and verified that I am speaking with the correct person using two identifiers. This visit type was conducted due to national recommendations for restrictions regarding the COVID-19 Pandemic (e.g. social distancing).  This format is felt to be most appropriate for this patient at this time.   I discussed the limitations of evaluation and management by telemedicine and the availability of in person appointments. The patient expressed understanding and agreed to proceed.  Only the patient and myself were on today's video visit. The patient was at home and I was in my office at the time of today's visit.   History of Present Illness:  Patient is a 58 yr old female who presents today for follow up.  HTN- maintained on amlodipine.  Reports that her bp 115/69 and pulse 71. Denies LE edema, CP or SOB.   BP Readings from Last 3 Encounters:  11/13/17 129/72  05/13/17 118/77  10/29/16 133/73   She has no new concerns.     Observations/Objective:  Gen: Awake, alert, no acute distress Resp: Breathing is even and non-labored Psych: calm/pleasant demeanor Neuro: Alert and Oriented x 3, + facial symmetry, speech is clear.  Assessment and Plan:  HTN- bp is stable. Tolerating norvasc.  Continue same.  Plan follow up after 10/30 for a complete physical with labs.   Follow Up Instructions:    I discussed the assessment and treatment plan with the patient. The patient was provided an opportunity to ask questions and all were answered. The patient agreed with the plan and demonstrated an understanding of the instructions.   The patient was advised to call back or seek an in-person evaluation if the symptoms worsen or if the condition fails to improve as anticipated.    Lemont Fillers, NP

## 2018-06-17 MED FILL — AMLODIPINE BESYLATE 5 MG TA: 5 | 90 days supply | Qty: 90 | Fill #0

## 2018-07-09 ENCOUNTER — Other Ambulatory Visit: Payer: Self-pay

## 2018-07-09 ENCOUNTER — Ambulatory Visit
Admission: RE | Admit: 2018-07-09 | Discharge: 2018-07-09 | Disposition: A | Discharge status: Home / Self Care | Payer: 59 | Source: Ambulatory Visit | Attending: Family | Admitting: Family

## 2018-07-09 DIAGNOSIS — Z1231 Encounter for screening mammogram for malignant neoplasm of breast: Secondary | ICD-10-CM

## 2018-09-15 MED FILL — AMLODIPINE BESYLATE 5 MG TA: 5 | 90 days supply | Qty: 90 | Fill #1

## 2018-11-19 ENCOUNTER — Encounter: Payer: 59 | Admitting: Family

## 2018-12-06 DIAGNOSIS — S99911A Unspecified injury of right ankle, initial encounter: Secondary | ICD-10-CM | POA: Diagnosis not present

## 2018-12-06 DIAGNOSIS — M25572 Pain in left ankle and joints of left foot: Secondary | ICD-10-CM | POA: Diagnosis not present

## 2018-12-06 DIAGNOSIS — S8252XA Displaced fracture of medial malleolus of left tibia, initial encounter for closed fracture: Secondary | ICD-10-CM | POA: Diagnosis not present

## 2018-12-06 DIAGNOSIS — T07XXXA Unspecified multiple injuries, initial encounter: Secondary | ICD-10-CM | POA: Diagnosis not present

## 2018-12-06 DIAGNOSIS — R52 Pain, unspecified: Secondary | ICD-10-CM | POA: Diagnosis not present

## 2018-12-06 DIAGNOSIS — S99912A Unspecified injury of left ankle, initial encounter: Secondary | ICD-10-CM | POA: Diagnosis not present

## 2018-12-06 DIAGNOSIS — W19XXXA Unspecified fall, initial encounter: Secondary | ICD-10-CM | POA: Diagnosis not present

## 2018-12-06 DIAGNOSIS — M25571 Pain in right ankle and joints of right foot: Secondary | ICD-10-CM | POA: Diagnosis not present

## 2018-12-06 DIAGNOSIS — G8911 Acute pain due to trauma: Secondary | ICD-10-CM | POA: Diagnosis not present

## 2018-12-06 DIAGNOSIS — M25471 Effusion, right ankle: Secondary | ICD-10-CM | POA: Diagnosis not present

## 2018-12-06 DIAGNOSIS — S82892A Other fracture of left lower leg, initial encounter for closed fracture: Secondary | ICD-10-CM | POA: Diagnosis not present

## 2018-12-08 ENCOUNTER — Other Ambulatory Visit (HOSPITAL_BASED_OUTPATIENT_CLINIC_OR_DEPARTMENT_OTHER): Payer: Self-pay | Admitting: Orthopedic Surgery

## 2018-12-08 ENCOUNTER — Other Ambulatory Visit: Payer: Self-pay

## 2018-12-08 ENCOUNTER — Ambulatory Visit (HOSPITAL_BASED_OUTPATIENT_CLINIC_OR_DEPARTMENT_OTHER)
Admission: RE | Admit: 2018-12-08 | Discharge: 2018-12-08 | Disposition: A | Discharge status: Home / Self Care | Payer: 59 | Source: Ambulatory Visit | Attending: Orthopedic Surgery | Admitting: Orthopedic Surgery

## 2018-12-08 ENCOUNTER — Telehealth: Payer: Self-pay | Admitting: Family

## 2018-12-08 ENCOUNTER — Encounter (HOSPITAL_BASED_OUTPATIENT_CLINIC_OR_DEPARTMENT_OTHER): Payer: Self-pay | Admitting: *Deleted

## 2018-12-08 ENCOUNTER — Other Ambulatory Visit (HOSPITAL_COMMUNITY): Payer: Self-pay | Admitting: Orthopedic Surgery

## 2018-12-08 DIAGNOSIS — S82852A Displaced trimalleolar fracture of left lower leg, initial encounter for closed fracture: Secondary | ICD-10-CM

## 2018-12-08 MED FILL — oxyCODONE HCL 5 MG TABS: 5 | 5 days supply | Qty: 20 | Fill #0

## 2018-12-08 NOTE — Telephone Encounter (Signed)
Copied from [phone]. Topic: General - Other >> Dec 08, 2018  3:55 PM Keene Breath wrote: Reason for CRM: Patient called to ask the doctor if she could make some accommodations for her since she broke her ankle and has an upcoming in office appt.  Patient is unable to come in, but will need medication refills soon.  Please advise and call patient to give her some options on what she can do.  CB# [phone]

## 2018-12-08 NOTE — Telephone Encounter (Signed)
Appointment changed to follow up virtual visit so she can get her refills.

## 2018-12-10 ENCOUNTER — Encounter: Payer: Self-pay | Admitting: Family

## 2018-12-10 ENCOUNTER — Ambulatory Visit (INDEPENDENT_AMBULATORY_CARE_PROVIDER_SITE_OTHER): Payer: 59 | Admitting: Family

## 2018-12-10 ENCOUNTER — Other Ambulatory Visit: Payer: Self-pay

## 2018-12-10 DIAGNOSIS — S82892A Other fracture of left lower leg, initial encounter for closed fracture: Secondary | ICD-10-CM | POA: Diagnosis not present

## 2018-12-10 DIAGNOSIS — M8580 Other specified disorders of bone density and structure, unspecified site: Secondary | ICD-10-CM

## 2018-12-10 DIAGNOSIS — I10 Essential (primary) hypertension: Secondary | ICD-10-CM

## 2018-12-10 NOTE — Progress Notes (Signed)
Virtual Visit via Video Note  I connected with Dominique Rice on 12/10/18 at  8:40 AM EST by a video enabled telemedicine application and verified that I am speaking with the correct person using two identifiers.  Location: Patient: home Provider: work   I discussed the limitations of evaluation and management by telemedicine and the availability of in person appointments. The patient expressed understanding and agreed to proceed.  History of Present Illness:  Patient is a 58 yr old female who presents today for follow up.  HTN- reports 116/68 the other day. She continues amlodipine.   BP Readings from Last 3 Encounters:  11/13/17 129/72  05/13/17 118/77  10/29/16 133/73   Left ankle fracture- tripped at the bottom of her steps on 11/21. Will have surgery on 12/16/18. She will have surgery with Dr. Victorino Dike.  She has had her flu shot.   Past Medical History:  Diagnosis Date  . Hypertension   . Osteopenia   . Pneumonia      Social History   Socioeconomic History  . Marital status: Married    Spouse name: Not on file  . Number of children: Not on file  . Years of education: Not on file  . Highest education level: Not on file  Occupational History    Employer: [employer]  Social Needs  . Financial resource strain: Not on file  . Food insecurity    Worry: Not on file    Inability: Not on file  . Transportation needs    Medical: Not on file    Non-medical: Not on file  Tobacco Use  . Smoking status: Never Smoker  . Smokeless tobacco: Never Used  Substance and Sexual Activity  . Alcohol use: Yes    Alcohol/week: 3.0 standard drinks    Types: 3 Glasses of wine per week  . Drug use: No  . Sexual activity: Not on file  Lifestyle  . Physical activity    Days per week: Not on file    Minutes per session: Not on file  . Stress: Not on file  Relationships  . Social Musician on phone: Not on file    Gets together: Not on file    Attends religious  service: Not on file    Active member of club or organization: Not on file    Attends meetings of clubs or organizations: Not on file    Relationship status: Not on file  . Intimate partner violence    Fear of current or ex partner: Not on file    Emotionally abused: Not on file    Physically abused: Not on file    Forced sexual activity: Not on file  Other Topics Concern  . 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 ABDOMINAL HYSTERECTOMY    . CESAREAN SECTION     twice  . COLONOSCOPY  2014  . ENDOMETRIAL ABLATION  2005  . OOPHORECTOMY Left 1998  . TUBAL LIGATION  1998    Family History  Problem Relation Age of Onset  . Cancer Mother 36       colon 2010  . Diabetes Mother        type II  . Colon  cancer Mother   . Macular degeneration Mother   . Other Mother        trigeminal neuralgia  . Atrial fibrillation Mother   . Coronary artery disease Father        28 had CABG  . Atrial fibrillation Father   . Hodgkin's lymphoma Son   . Esophageal cancer Neg Hx   . Rectal cancer Neg Hx   . Stomach cancer Neg Hx     Allergies  Allergen Reactions  . Shrimp [Shellfish Allergy] Nausea And Vomiting    09/29/12  Pt states she can tolerate betadine and IV dyes. Tf,cma(aama)    Current Outpatient Medications on File Prior to Visit  Medication Sig Dispense Refill  . amLODipine (NORVASC) 5 MG tablet Take 1 tablet (5 mg total) by mouth daily. 90 tablet 1  . Calcium-Magnesium-Vitamin D (CALCIUM 500 PO) Take 1 tablet by mouth 2 (two) times daily.    . clobetasol (TEMOVATE) 0.05 % external solution Apply 1 application topically 2 (two) times daily.    Marland Kitchen ketoconazole (NIZORAL) 2 % shampoo Apply 1 application topically 2 (two) times a week.    . Multiple Vitamin (MULTIVITAMIN)  tablet Take 1 tablet by mouth daily.     No current facility-administered medications on file prior to visit.     There were no vitals taken for this visit.     Observations/Objective:   Gen: Awake, alert, no acute distress Resp: Breathing is even and non-labored Psych: calm/pleasant demeanor Neuro: Alert and Oriented x 3, + facial symmetry, speech is clear.   Assessment and Plan:  Left ankle fracture- new.  Scheduled for surgery with Dr. Doran Durand.  HTN- bp stable on current dose of amlodipine.  Continue same.  Osteopenia- last bone density was 2018- noted osteopenia.  Plan to reorder bone density at her upcoming cpx.  She is advised to continue calcium supplementation in the meantime.  She will schedule a cpx after her ankle is healed.  Follow Up Instructions:    I discussed the assessment and treatment plan with the patient. The patient was provided an opportunity to ask questions and all were answered. The patient agreed with the plan and demonstrated an understanding of the instructions.   The patient was advised to call back or seek an in-person evaluation if the symptoms worsen or if the condition fails to improve as anticipated.  Nance Pear, NP

## 2018-12-12 ENCOUNTER — Other Ambulatory Visit (HOSPITAL_COMMUNITY): Admission: RE | Admit: 2018-12-12 | Payer: 59 | Source: Ambulatory Visit

## 2018-12-13 ENCOUNTER — Other Ambulatory Visit (HOSPITAL_COMMUNITY)
Admission: RE | Admit: 2018-12-13 | Discharge: 2018-12-13 | Disposition: A | Discharge status: Home / Self Care | Payer: 59 | Source: Ambulatory Visit | Attending: Orthopedic Surgery | Admitting: Orthopedic Surgery

## 2018-12-13 DIAGNOSIS — Z20828 Contact with and (suspected) exposure to other viral communicable diseases: Secondary | ICD-10-CM | POA: Diagnosis not present

## 2018-12-13 DIAGNOSIS — Z01812 Encounter for preprocedural laboratory examination: Secondary | ICD-10-CM | POA: Insufficient documentation

## 2018-12-13 LAB — SARS CORONAVIRUS 2 (TAT 6-24 HRS): SARS Coronavirus 2: NEGATIVE

## 2018-12-15 NOTE — Anesthesia Preprocedure Evaluation (Addendum)
Anesthesia Evaluation  Patient identified by MRN, date of birth, ID band Patient awake    Reviewed: Allergy & Precautions, NPO status , Patient's Chart, lab work & pertinent test results  Airway Mallampati: II  TM Distance: >3 FB Neck ROM: Full    Dental no notable dental hx. (+) Teeth Intact, Dental Advisory Given   Pulmonary neg pulmonary ROS,    Pulmonary exam normal breath sounds clear to auscultation       Cardiovascular hypertension, Pt. on medications Normal cardiovascular exam Rhythm:Regular Rate:Normal     Neuro/Psych negative neurological ROS  negative psych ROS   GI/Hepatic Neg liver ROS,   Endo/Other  negative endocrine ROS  Renal/GU negative Renal ROS     Musculoskeletal   Abdominal   Peds  Hematology   Anesthesia Other Findings   Reproductive/Obstetrics                            Anesthesia Physical Anesthesia Plan  ASA: II  Anesthesia Plan: General   Post-op Pain Management:  Regional for Post-op pain   Induction:   PONV Risk Score and Plan: 4 or greater and Treatment may vary due to age or medical condition, Ondansetron, Dexamethasone and Midazolam  Airway Management Planned: LMA  Additional Equipment: None  Intra-op Plan:   Post-operative Plan:   Informed Consent: I have reviewed the patients History and Physical, chart, labs and discussed the procedure including the risks, benefits and alternatives for the proposed anesthesia with the patient or authorized representative who has indicated his/her understanding and acceptance.     Dental advisory given  Plan Discussed with:   Anesthesia Plan Comments: (LMA w L Popliteal and adductor canal block)       Anesthesia Quick Evaluation

## 2018-12-16 ENCOUNTER — Encounter (HOSPITAL_BASED_OUTPATIENT_CLINIC_OR_DEPARTMENT_OTHER)
Admission: RE | Disposition: A | Discharge status: Home / Self Care | Payer: Self-pay | Source: Home / Self Care | Attending: Orthopedic Surgery

## 2018-12-16 ENCOUNTER — Ambulatory Visit (HOSPITAL_BASED_OUTPATIENT_CLINIC_OR_DEPARTMENT_OTHER): Payer: 59 | Admitting: Anesthesiology

## 2018-12-16 ENCOUNTER — Ambulatory Visit (HOSPITAL_BASED_OUTPATIENT_CLINIC_OR_DEPARTMENT_OTHER)
Admission: RE | Admit: 2018-12-16 | Discharge: 2018-12-16 | Disposition: A | Discharge status: Home / Self Care | Payer: 59 | Attending: Orthopedic Surgery | Admitting: Orthopedic Surgery

## 2018-12-16 ENCOUNTER — Encounter (HOSPITAL_BASED_OUTPATIENT_CLINIC_OR_DEPARTMENT_OTHER): Payer: Self-pay | Admitting: *Deleted

## 2018-12-16 ENCOUNTER — Other Ambulatory Visit: Payer: Self-pay

## 2018-12-16 DIAGNOSIS — W19XXXA Unspecified fall, initial encounter: Secondary | ICD-10-CM | POA: Diagnosis not present

## 2018-12-16 DIAGNOSIS — I10 Essential (primary) hypertension: Secondary | ICD-10-CM | POA: Diagnosis not present

## 2018-12-16 DIAGNOSIS — S93422A Sprain of deltoid ligament of left ankle, initial encounter: Secondary | ICD-10-CM | POA: Insufficient documentation

## 2018-12-16 DIAGNOSIS — J189 Pneumonia, unspecified organism: Secondary | ICD-10-CM | POA: Diagnosis not present

## 2018-12-16 DIAGNOSIS — S82852A Displaced trimalleolar fracture of left lower leg, initial encounter for closed fracture: Secondary | ICD-10-CM | POA: Insufficient documentation

## 2018-12-16 DIAGNOSIS — Z79899 Other long term (current) drug therapy: Secondary | ICD-10-CM | POA: Insufficient documentation

## 2018-12-16 DIAGNOSIS — G8918 Other acute postprocedural pain: Secondary | ICD-10-CM | POA: Diagnosis not present

## 2018-12-16 HISTORY — DX: Essential (primary) hypertension: I10

## 2018-12-16 HISTORY — PX: ORIF ANKLE FRACTURE: SHX5408

## 2018-12-16 SURGERY — OPEN REDUCTION INTERNAL FIXATION (ORIF) ANKLE FRACTURE
Anesthesia: General | Site: Ankle | Laterality: Left

## 2018-12-16 MED ORDER — ONDANSETRON HCL 4 MG/2ML IJ SOLN: 4.0000 mg | Freq: Once | INTRAMUSCULAR | Status: DC | PRN

## 2018-12-16 MED ORDER — OXYCODONE HCL 5 MG/5ML PO SOLN: 5.0000 mg | Freq: Once | ORAL | Status: DC | PRN

## 2018-12-16 MED ORDER — CEFAZOLIN SODIUM-DEXTROSE 2-4 GM/100ML-% IV SOLN
2.0000 g | INTRAVENOUS | Status: AC
  Administered 2018-12-16: 2 g via INTRAVENOUS

## 2018-12-16 MED ORDER — SODIUM CHLORIDE 0.9 % IV SOLN: INTRAVENOUS | Status: DC

## 2018-12-16 MED ORDER — DOCUSATE SODIUM 100 MG PO CAPS: 100.0000 mg | ORAL_CAPSULE | Freq: Two times a day (BID) | ORAL | 0 refills | Status: DC

## 2018-12-16 MED ORDER — ACETAMINOPHEN 500 MG PO TABS
ORAL_TABLET | ORAL | Status: AC
  Filled 2018-12-16: qty 2

## 2018-12-16 MED ORDER — MIDAZOLAM HCL 2 MG/2ML IJ SOLN
1.0000 mg | INTRAMUSCULAR | Status: DC | PRN
  Administered 2018-12-16: 12:00:00 2 mg via INTRAVENOUS

## 2018-12-16 MED ORDER — ASPIRIN EC 81 MG PO TBEC: 81.0000 mg | DELAYED_RELEASE_TABLET | Freq: Two times a day (BID) | ORAL | 0 refills | Status: DC

## 2018-12-16 MED ORDER — PHENYLEPHRINE HCL (PRESSORS) 10 MG/ML IV SOLN
INTRAVENOUS | Status: DC | PRN
  Administered 2018-12-16 (×3): 40 ug via INTRAVENOUS

## 2018-12-16 MED ORDER — CEFAZOLIN SODIUM-DEXTROSE 2-4 GM/100ML-% IV SOLN
INTRAVENOUS | Status: AC
  Filled 2018-12-16: qty 100

## 2018-12-16 MED ORDER — 0.9 % SODIUM CHLORIDE (POUR BTL) OPTIME
TOPICAL | Status: DC | PRN
  Administered 2018-12-16: 50 mL

## 2018-12-16 MED ORDER — CLONIDINE HCL (ANALGESIA) 100 MCG/ML EP SOLN
EPIDURAL | Status: DC | PRN
  Administered 2018-12-16: 50 ug
  Administered 2018-12-16: 100 ug

## 2018-12-16 MED ORDER — ROPIVACAINE HCL 5 MG/ML IJ SOLN
INTRAMUSCULAR | Status: DC | PRN
  Administered 2018-12-16: 15 mL via PERINEURAL
  Administered 2018-12-16: 30 mL via PERINEURAL

## 2018-12-16 MED ORDER — CHLORHEXIDINE GLUCONATE 4 % EX LIQD: 60.0000 mL | Freq: Once | CUTANEOUS | Status: DC

## 2018-12-16 MED ORDER — KETOROLAC TROMETHAMINE 30 MG/ML IJ SOLN: 30.0000 mg | Freq: Once | INTRAMUSCULAR | Status: DC | PRN

## 2018-12-16 MED ORDER — SENNA 8.6 MG PO TABS: 2.0000 | ORAL_TABLET | Freq: Two times a day (BID) | ORAL | 0 refills | Status: DC

## 2018-12-16 MED ORDER — DEXAMETHASONE SODIUM PHOSPHATE 10 MG/ML IJ SOLN
INTRAMUSCULAR | Status: AC
  Filled 2018-12-16: qty 1

## 2018-12-16 MED ORDER — LIDOCAINE HCL (CARDIAC) PF 100 MG/5ML IV SOSY
PREFILLED_SYRINGE | INTRAVENOUS | Status: DC | PRN
  Administered 2018-12-16: 100 mg via INTRAVENOUS

## 2018-12-16 MED ORDER — HYDROMORPHONE HCL 1 MG/ML IJ SOLN: 0.2500 mg | INTRAMUSCULAR | Status: DC | PRN

## 2018-12-16 MED ORDER — FENTANYL CITRATE (PF) 100 MCG/2ML IJ SOLN
INTRAMUSCULAR | Status: AC
  Filled 2018-12-16: qty 2

## 2018-12-16 MED ORDER — OXYCODONE HCL 5 MG PO TABS: 5.0000 mg | ORAL_TABLET | ORAL | 0 refills | Status: AC | PRN

## 2018-12-16 MED ORDER — LACTATED RINGERS IV SOLN
INTRAVENOUS | Status: DC
  Administered 2018-12-16: 12:00:00 via INTRAVENOUS

## 2018-12-16 MED ORDER — OXYCODONE HCL 5 MG PO TABS: 5.0000 mg | ORAL_TABLET | Freq: Once | ORAL | Status: DC | PRN

## 2018-12-16 MED ORDER — DEXAMETHASONE SODIUM PHOSPHATE 10 MG/ML IJ SOLN
INTRAMUSCULAR | Status: DC | PRN
  Administered 2018-12-16: 5 mg via INTRAVENOUS

## 2018-12-16 MED ORDER — FENTANYL CITRATE (PF) 100 MCG/2ML IJ SOLN
50.0000 ug | INTRAMUSCULAR | Status: AC | PRN
  Administered 2018-12-16: 25 ug via INTRAVENOUS
  Administered 2018-12-16: 100 ug via INTRAVENOUS
  Administered 2018-12-16: 25 ug via INTRAVENOUS
  Administered 2018-12-16: 50 ug via INTRAVENOUS

## 2018-12-16 MED ORDER — ACETAMINOPHEN 500 MG PO TABS
1000.0000 mg | ORAL_TABLET | Freq: Once | ORAL | Status: AC
  Administered 2018-12-16: 1000 mg via ORAL

## 2018-12-16 MED ORDER — MIDAZOLAM HCL 2 MG/2ML IJ SOLN
INTRAMUSCULAR | Status: AC
  Filled 2018-12-16: qty 2

## 2018-12-16 MED ORDER — EPHEDRINE SULFATE 50 MG/ML IJ SOLN
INTRAMUSCULAR | Status: DC | PRN
  Administered 2018-12-16 (×3): 10 mg via INTRAVENOUS

## 2018-12-16 MED ORDER — PROPOFOL 10 MG/ML IV BOLUS
INTRAVENOUS | Status: DC | PRN
  Administered 2018-12-16: 150 mg via INTRAVENOUS

## 2018-12-16 MED FILL — DOCUSATE NA 100 MG SOFTGEL: 100 | 50 days supply | Qty: 100 | Fill #0

## 2018-12-16 MED FILL — SENNA 8.6 MG TABS: 8.6 | 25 days supply | Qty: 100 | Fill #0

## 2018-12-16 MED FILL — oxyCODONE HCL 5 MG TABS: 5 | 5 days supply | Qty: 30 | Fill #0

## 2018-12-16 MED FILL — ASPIRIN 81MG ADULT LOW STRE: 81 | 42 days supply | Qty: 84 | Fill #0

## 2018-12-16 SURGICAL SUPPLY — 65 items
BANDAGE ESMARK 6X9 LF (GAUZE/BANDAGES/DRESSINGS) ×1 IMPLANT
BIT DRILL 2.5X2.75 QC CALB (BIT) ×3 IMPLANT
BIT DRILL 2.9 CANN QC NONSTRL (BIT) ×3 IMPLANT
BIT DRILL 3.5X5.5 QC CALB (BIT) ×3 IMPLANT
BLADE SURG 15 STRL LF DISP TIS (BLADE) ×2 IMPLANT
BLADE SURG 15 STRL SS (BLADE) ×4
BNDG COHESIVE 4X5 TAN STRL (GAUZE/BANDAGES/DRESSINGS) ×3 IMPLANT
BNDG COHESIVE 6X5 TAN STRL LF (GAUZE/BANDAGES/DRESSINGS) ×3 IMPLANT
BNDG ESMARK 6X9 LF (GAUZE/BANDAGES/DRESSINGS) ×3
CANISTER SUCT 1200ML W/VALVE (MISCELLANEOUS) ×3 IMPLANT
CHLORAPREP W/TINT 26 (MISCELLANEOUS) ×3 IMPLANT
COVER BACK TABLE REUSABLE LG (DRAPES) ×3 IMPLANT
CUFF TOURN SGL QUICK 34 (TOURNIQUET CUFF) ×2
CUFF TRNQT CYL 34X4.125X (TOURNIQUET CUFF) ×1 IMPLANT
DRAPE EXTREMITY T 121X128X90 (DISPOSABLE) ×3 IMPLANT
DRAPE HALF SHEET 70X43 (DRAPES) ×3 IMPLANT
DRAPE OEC MINIVIEW 54X84 (DRAPES) ×3 IMPLANT
DRAPE U-SHAPE 47X51 STRL (DRAPES) ×3 IMPLANT
DRSG MEPITEL 4X7.2 (GAUZE/BANDAGES/DRESSINGS) ×3 IMPLANT
DRSG PAD ABDOMINAL 8X10 ST (GAUZE/BANDAGES/DRESSINGS) ×6 IMPLANT
ELECT REM PT RETURN 9FT ADLT (ELECTROSURGICAL) ×3
ELECTRODE REM PT RTRN 9FT ADLT (ELECTROSURGICAL) ×1 IMPLANT
GAUZE SPONGE 4X4 12PLY STRL (GAUZE/BANDAGES/DRESSINGS) ×3 IMPLANT
GLOVE BIO SURGEON STRL SZ8 (GLOVE) ×3 IMPLANT
GLOVE BIOGEL PI IND STRL 8 (GLOVE) ×2 IMPLANT
GLOVE BIOGEL PI INDICATOR 8 (GLOVE) ×4
GLOVE ECLIPSE 8.0 STRL XLNG CF (GLOVE) ×3 IMPLANT
GOWN STRL REUS W/ TWL XL LVL3 (GOWN DISPOSABLE) ×3 IMPLANT
GOWN STRL REUS W/TWL XL LVL3 (GOWN DISPOSABLE) ×6
K-WIRE ACE 1.6X6 (WIRE) ×6
KWIRE ACE 1.6X6 (WIRE) ×2 IMPLANT
NS IRRIG 1000ML POUR BTL (IV SOLUTION) ×3 IMPLANT
PACK BASIN DAY SURGERY FS (CUSTOM PROCEDURE TRAY) ×3 IMPLANT
PAD CAST 4YDX4 CTTN HI CHSV (CAST SUPPLIES) ×1 IMPLANT
PADDING CAST COTTON 4X4 STRL (CAST SUPPLIES) ×2
PADDING CAST COTTON 6X4 STRL (CAST SUPPLIES) ×3 IMPLANT
PENCIL SMOKE EVACUATOR (MISCELLANEOUS) ×3 IMPLANT
SANITIZER HAND PURELL 535ML FO (MISCELLANEOUS) ×3 IMPLANT
SCREW ACE CAN 4.0 38M (Screw) ×3 IMPLANT
SCREW ACE CAN 4.0 40M (Screw) ×6 IMPLANT
SCREW CORTICAL 3.5MM  20MM (Screw) ×6 IMPLANT
SCREW CORTICAL 3.5MM  30MM (Screw) ×2 IMPLANT
SCREW CORTICAL 3.5MM 14MM (Screw) ×9 IMPLANT
SCREW CORTICAL 3.5MM 18MM (Screw) ×3 IMPLANT
SCREW CORTICAL 3.5MM 20MM (Screw) ×3 IMPLANT
SCREW CORTICAL 3.5MM 30MM (Screw) ×1 IMPLANT
SLEEVE SCD COMPRESS KNEE MED (MISCELLANEOUS) ×3 IMPLANT
SPLINT FAST PLASTER 5X30 (CAST SUPPLIES) ×40
SPLINT PLASTER CAST FAST 5X30 (CAST SUPPLIES) ×20 IMPLANT
SPONGE LAP 18X18 RF (DISPOSABLE) ×3 IMPLANT
STOCKINETTE 6  STRL (DRAPES) ×2
STOCKINETTE 6 STRL (DRAPES) ×1 IMPLANT
SUCTION FRAZIER HANDLE 10FR (MISCELLANEOUS) ×2
SUCTION TUBE FRAZIER 10FR DISP (MISCELLANEOUS) ×1 IMPLANT
SUT ETHILON 3 0 PS 1 (SUTURE) ×6 IMPLANT
SUT VIC AB 0 SH 27 (SUTURE) ×3 IMPLANT
SUT VIC AB 2-0 SH 27 (SUTURE) ×4
SUT VIC AB 2-0 SH 27XBRD (SUTURE) ×2 IMPLANT
SYR BULB 3OZ (MISCELLANEOUS) ×3 IMPLANT
TOWEL GREEN STERILE FF (TOWEL DISPOSABLE) ×6 IMPLANT
TUBE CONNECTING 20'X1/4 (TUBING) ×1
TUBE CONNECTING 20X1/4 (TUBING) ×2 IMPLANT
UNDERPAD 30X36 HEAVY ABSORB (UNDERPADS AND DIAPERS) ×3 IMPLANT
WASHER FLAT ACE (Orthopedic Implant) ×4 IMPLANT
WASHER PLAIN FLAT ACE NS 3PK (Orthopedic Implant) ×2 IMPLANT

## 2018-12-16 NOTE — H&P (Signed)
Dominique Rice is an 58 y.o. female.   Chief Complaint: Left ankle injury HPI: The patient is a 58 year old female without significant past medical history.  She fell injuring her left ankle approximately a week ago.  She presents today for operative treatment of this displaced and unstable left ankle trimalleolar fracture.  Past Medical History:  Diagnosis Date  . Hypertension   . Osteopenia   . Pneumonia     Past Surgical History:  Procedure Laterality Date  . ABDOMINAL HYSTERECTOMY    . CESAREAN SECTION     twice  . COLONOSCOPY  2014  . ENDOMETRIAL ABLATION  2005  . OOPHORECTOMY Left 1998  . TUBAL LIGATION  1998    Family History  Problem Relation Age of Onset  . Cancer Mother 57       colon 2010  . Diabetes Mother        type II  . Colon cancer Mother   . Macular degeneration Mother   . Other Mother        trigeminal neuralgia  . Atrial fibrillation Mother   . Coronary artery disease Father        73 had CABG  . Atrial fibrillation Father   . Hodgkin's lymphoma Son   . Esophageal cancer Neg Hx   . Rectal cancer Neg Hx   . Stomach cancer Neg Hx    Social History:  reports that she has never smoked. She has never used smokeless tobacco. She reports current alcohol use of about 3.0 standard drinks of alcohol per week. She reports that she does not use drugs.  Allergies:  Allergies  Allergen Reactions  . Shrimp [Shellfish Allergy] Nausea And Vomiting    09/29/12  Pt states she can tolerate betadine and IV dyes. Tf,cma(aama)    Medications Prior to Admission  Medication Sig Dispense Refill  . amLODipine (NORVASC) 5 MG tablet Take 1 tablet (5 mg total) by mouth daily. 90 tablet 1  . Calcium-Magnesium-Vitamin D (CALCIUM 500 PO) Take 1 tablet by mouth 2 (two) times daily.    . Multiple Vitamin (MULTIVITAMIN) tablet Take 1 tablet by mouth daily.    . naproxen sodium (ALEVE) 220 MG tablet Take 220 mg by mouth.    . clobetasol (TEMOVATE) 0.05 % external solution  Apply 1 application topically 2 (two) times daily.    Marland Kitchen ketoconazole (NIZORAL) 2 % shampoo Apply 1 application topically 2 (two) times a week.      No results found for this or any previous visit (from the past 48 hour(s)). No results found.  ROS no recent fever, chills, nausea, vomiting or changes in her appetite  Blood pressure 127/71, pulse 60, temperature 97.7 F (36.5 C), temperature source Oral, resp. rate 17, height 5\' 8"  (1.727 m), weight 77.1 kg, SpO2 100 %. Physical Exam  Well-nourished well-developed woman in no apparent distress.  Alert and oriented x4.  Mood and affect are normal.  Extraocular motions are intact.  Respirations are unlabored.  Gait is nonweightbearing on the left.  Left ankle is slightly swollen.  No blisters.  Skin wrinkles appropriately.  No lymphadenopathy.  Pulses in the foot are palpable.  Intact sensibility to light touch dorsally and plantarly at the forefoot.  Active plantar flexion and dorsiflexion strength at the toes.  Assessment/Plan Left ankle trimalleolar fracture -to the operating room today for open treatment with internal fixation and possible fixation of the syndesmosis.  The risks and benefits of the alternative treatment options have been discussed  in detail.  The patient wishes to proceed with surgery and specifically understands risks of bleeding, infection, nerve damage, blood clots, need for additional surgery, amputation and death.   Wylene Simmer, MD 12-30-18, 12:33 PM

## 2018-12-16 NOTE — Anesthesia Procedure Notes (Signed)
Anesthesia Regional Block: Adductor canal block   Pre-Anesthetic Checklist: ,, timeout performed, Correct Patient, Correct Site, Correct Laterality, Correct Procedure, Correct Position, site marked, Risks and benefits discussed,  Surgical consent,  Pre-op evaluation,  At surgeon's request and post-op pain management  Laterality: Lower and Left  Prep: chloraprep       Needles:  Injection technique: Single-shot  Needle Type: Echogenic Needle     Needle Length: 9cm  Needle Gauge: 22     Additional Needles:   Procedures:,,,, ultrasound used (permanent image in chart),,,,  Narrative:  Start time: 12/16/2018 12:20 PM End time: 12/16/2018 12:26 PM Injection made incrementally with aspirations every 5 mL.  Performed by: Personally  Anesthesiologist: Barnet Glasgow, MD  Additional Notes: Block assessed prior to surgery. Pt tolerated procedure well.

## 2018-12-16 NOTE — Progress Notes (Signed)
AssistedDr. Houser with left, ultrasound guided, popliteal, adductor canal block. Side rails up, monitors on throughout procedure. See vital signs in flow sheet. Tolerated Procedure well.  

## 2018-12-16 NOTE — Anesthesia Postprocedure Evaluation (Signed)
Anesthesia Post Note  Patient: Dominique Rice  Procedure(s) Performed: OPEN REDUCTION INTERNAL FIXATION (ORIF) Left ankle trimalleolar fracture (Left Ankle)     Patient location during evaluation: PACU Anesthesia Type: General and Regional Level of consciousness: awake and alert Pain management: pain level controlled Vital Signs Assessment: post-procedure vital signs reviewed and stable Respiratory status: spontaneous breathing, nonlabored ventilation, respiratory function stable and patient connected to nasal cannula oxygen Cardiovascular status: blood pressure returned to baseline and stable Postop Assessment: no apparent nausea or vomiting Anesthetic complications: no    Last Vitals:  Vitals:   12/16/18 1437 12/16/18 1445  BP:  (!) 122/58  Pulse: 95 81  Resp: 14 15  Temp: 36.6 C   SpO2: 100% 100%    Last Pain:  Vitals:   12/16/18 1437  TempSrc:   PainSc: 0-No pain    LLE Motor Response: No movement due to regional block (12/16/18 1445) LLE Sensation: Numbness (12/16/18 1445)          Barnet Glasgow

## 2018-12-16 NOTE — Transfer of Care (Signed)
Immediate Anesthesia Transfer of Care Note  Patient: Dominique Rice  Procedure(s) Performed: OPEN REDUCTION INTERNAL FIXATION (ORIF) Left ankle trimalleolar fracture (Left Ankle)  Patient Location: PACU  Anesthesia Type:General and Regional  Level of Consciousness: awake and alert   Airway & Oxygen Therapy: Patient Spontanous Breathing and Patient connected to face mask oxygen  Post-op Assessment: Report given to RN and Post -op Vital signs reviewed and stable  Post vital signs: Reviewed and stable  Last Vitals:  Vitals Value Taken Time  BP 118/53 12/16/18 1436  Temp    Pulse 93 12/16/18 1438  Resp 26 12/16/18 1438  SpO2 100 % 12/16/18 1438  Vitals shown include unvalidated device data.  Last Pain:  Vitals:   12/16/18 1153  TempSrc: Oral  PainSc: 4       Patients Stated Pain Goal: 2 (46/96/29 [id number])  Complications: No apparent anesthesia complications

## 2018-12-16 NOTE — Op Note (Signed)
12/16/2018  2:53 PM  PATIENT:  Dominique Rice  58 y.o. female  PRE-OPERATIVE DIAGNOSIS:  Left ankle trimalleolar fracture  POST-OPERATIVE DIAGNOSIS:  Left ankle trimalleolar fracture  Procedure(s): 1.  Open treatment of left ankle trimalleolar fracture with internal fixation including fixation of the posterior lip 2.  Stress examination of the left ankle under fluoroscopy 3.  Repair of left ankle deltoid ligament 4.  AP, lateral and mortise radiographs of the left ankle  SURGEON:  Wylene Simmer, MD  ASSISTANT: Mechele Claude, PA-C  ANESTHESIA:   General, regional  EBL:  minimal   TOURNIQUET:   Total Tourniquet Time Documented: Thigh (Left) - 71 minutes Total: Thigh (Left) - 71 minutes  COMPLICATIONS:  None apparent  DISPOSITION:  Extubated, awake and stable to recovery.  INDICATION FOR PROCEDURE: The patient is a 58 year old female who fell approximately a week ago injuring her left ankle.  She sustained a trimalleolar fracture dislocation and underwent closed reduction and splinting out of town.  She presents now for operative treatment of this displaced and unstable left ankle trimalleolar fracture.  The risks and benefits of the alternative treatment options have been discussed in detail.  The patient wishes to proceed with surgery and specifically understands risks of bleeding, infection, nerve damage, blood clots, need for additional surgery, amputation and death.  PROCEDURE IN DETAIL:  After pre operative consent was obtained, and the correct operative site was identified, the patient was brought to the operating room and placed supine on the OR table.  Anesthesia was administered.  Pre-operative antibiotics were administered.  A surgical timeout was taken.  The left lower extremity was prepped and draped in standard sterile fashion with a tourniquet around the thigh.  The extremity was exsanguinated and the tourniquet was inflated to 250 mmHg.  A longitudinal incision was  made at the posterior medial border of the tibia overlying the posterior tibial tendon.  Dissection was carried down through the subcutaneous tissues.  The medial malleolus fracture site was identified.  The posterior malleolus fracture was identified after elevating the posterior tibial tendon sheath.  The posterior malleolus fracture was mobilized and reduced.  It was provisionally pinned.  Radiographs confirmed appropriate reduction of the fracture.  A 4 mm partially-threaded cannulated screw was then inserted across the fracture site.  It compressed the fracture site appropriately with the use of a washer.  At the apex of the fracture site a guidepin was inserted.  The guidepin was overdrilled and removed.  A 3.5 mm fully threaded screw with a washer was placed as a buttress plate.  The medial malleolus fracture was then reduced and pinned.  Significant comminution was noted.  Radiographs confirmed appropriate reduction.  The fracture was fixed with two 4 mm partially-threaded cannulated screws.  Attention was then turned to the lateral malleolus where a longitudinal incision was made.  Dissection was carried down through the subcutaneous tissues take care to protect branches of the superficial peroneal nerve.  Fracture site was identified.  Was cleaned of all hematoma and reduced.  It was held with a tenaculum.  3.5 mm lag screw was inserted from anterior to posterior and was noted to compress the fracture site appropriately.  A 7 hole one third tubular plate from the [employer] small frag set was contoured to fit the lateral malleolus.  It was fixed proximally with 3 bicortical screws and distally with 3 unicortical screws.  AP, mortise and lateral radiographs then confirmed appropriate reduction of the lateral and posterior  malleolus fractures.  The medial malleolus fracture however had shifted medially.  Both medial malleolus screws were then removed.  The medial malleolus fracture was rereduced.   The guidepin was repositioned in better bone.  Radiographs confirmed appropriate reduction.  A 4 mm partially-threaded cannulated screw was then inserted but left slightly proud.  A second 4 mm screw was inserted in the metaphyseal bone and used as an anchor for a tension band.  This was necessary due to the comminution at the medial malleolus.  The tension band was tightened appropriately and the medial malleolus screw also tightened.  The metaphyseal screw was also tightened.  Final AP, lateral and mortise radiographs confirmed appropriate position and length of all hardware and appropriate reduction of medial, lateral and posterior malleolus fractures.  On stress examination the syndesmosis was noted to be stable but there was instability noted at the deltoid ligament.  Repair of the deltoid ligament was then performed with 0 Vicryl figure-of-eight sutures.  Deep and superficial layers were approximated.  Medial and lateral wounds were then irrigated copiously.  Subcutaneous tissues were approximated with 2-0 Vicryl.  Skin incisions were closed with nylon.  Sterile dressings were applied followed by a well-padded short leg splint.  The tourniquet was released after application of the dressings.  The patient was awakened from anesthesia and transported to the recovery room in stable condition.   FOLLOW UP PLAN: Nonweightbearing on the left lower extremity.  Follow-up in the office in 2 weeks for suture removal and conversion to a short leg cast.  Plan 6 weeks nonweightbearing postop immobilization.  Aspirin for DVT prophylaxis.   RADIOGRAPHS: AP, lateral and mortise radiographs of the left ankle are obtained intraoperatively.  These show interval reduction and fixation of medial, lateral and posterior malleolus fractures.  Hardware is appropriately positioned and of the appropriate lengths.  No other acute injuries are noted.    Alfredo Martinez PA-C was present and scrubbed for the duration of the  operative case. His assistance was essential in positioning the patient, prepping and draping, gaining and maintaining exposure, performing the operation, closing and dressing the wounds and applying the splint.

## 2018-12-16 NOTE — Discharge Instructions (Addendum)
Post Anesthesia Home Care Instructions  Activity: Get plenty of rest for the remainder of the day. A responsible individual must stay with you for 24 hours following the procedure.  For the next 24 hours, DO NOT: -Drive a car -Paediatric nurse -Drink alcoholic beverages -Take any medication unless instructed by your physician -Make any legal decisions or sign important papers.  Meals: Start with liquid foods such as gelatin or soup. Progress to regular foods as tolerated. Avoid greasy, spicy, heavy foods. If nausea and/or vomiting occur, drink only clear liquids until the nausea and/or vomiting subsides. Call your physician if vomiting continues.  Special Instructions/Symptoms: Your throat may feel dry or sore from the anesthesia or the breathing tube placed in your throat during surgery. If this causes discomfort, gargle with warm salt water. The discomfort should disappear within 24 hours.  If you had a scopolamine patch placed behind your ear for the management of post- operative nausea and/or vomiting:  1. The medication in the patch is effective for 72 hours, after which it should be removed.  Wrap patch in a tissue and discard in the trash. Wash hands thoroughly with soap and water. 2. You may remove the patch earlier than 72 hours if you experience unpleasant side effects which may include dry mouth, dizziness or visual disturbances. 3. Avoid touching the patch. Wash your hands with soap and water after contact with the patch.       Regional Anesthesia Blocks  1. Numbness or the inability to move the "blocked" extremity may last from 3-48 hours after placement. The length of time depends on the medication injected and your individual response to the medication. If the numbness is not going away after 48 hours, call your surgeon.  2. The extremity that is blocked will need to be protected until the numbness is gone and the  Strength has returned. Because you cannot feel it, you  will need to take extra care to avoid injury. Because it may be weak, you may have difficulty moving it or using it. You may not know what position it is in without looking at it while the block is in effect.  3. For blocks in the legs and feet, returning to weight bearing and walking needs to be done carefully. You will need to wait until the numbness is entirely gone and the strength has returned. You should be able to move your leg and foot normally before you try and bear weight or walk. You will need someone to be with you when you first try to ensure you do not fall and possibly risk injury.  4. Bruising and tenderness at the needle site are common side effects and will resolve in a few days.  5. Persistent numbness or new problems with movement should be communicated to the surgeon or the [facility] [phone] [place] [phone]).   Wylene Simmer, MD [facility]  Please read the following information regarding your care after surgery.  Medications  You only need a prescription for the narcotic pain medicine (ex. oxycodone, Percocet, Norco).  All of the other medicines listed below are available over the counter. X Naproxen that you have at home, as directed, for the first 3 days after surgery. X acetominophen (Tylenol) 650 mg every 4-6 hours as you need for minor to moderate pain X oxycodone as prescribed for severe pain  Narcotic pain medicine (ex. oxycodone, Percocet, Vicodin) will cause constipation.  To prevent this problem, take the following medicines  while you are taking any pain medicine. X docusate sodium (Colace) 100 mg twice a day X senna (Senokot) 2 tablets twice a day  X To help prevent blood clots, take a baby aspirin (81 mg) twice a day after surgery.  You should also get up every hour while you are awake to move around.    Weight Bearing X Do not bear any weight on the operated leg or foot.  Cast / Splint / Dressing X Keep your  splint, cast or dressing clean and dry.  Dont put anything (coat hanger, pencil, etc) down inside of it.  If it gets damp, use a hair dryer on the cool setting to dry it.  If it gets soaked, call the office to schedule an appointment for a cast change.   After your dressing, cast or splint is removed; you may shower, but do not soak or scrub the wound.  Allow the water to run over it, and then gently pat it dry.  Swelling It is normal for you to have swelling where you had surgery.  To reduce swelling and pain, keep your toes above your nose for at least 3 days after surgery.  It may be necessary to keep your foot or leg elevated for several weeks.  If it hurts, it should be elevated.  Follow Up Call my office at [phone] when you are discharged from the hospital or surgery center to schedule an appointment to be seen two weeks after surgery.  Call my office at [phone] if you develop a fever >101.5 F, nausea, vomiting, bleeding from the surgical site or severe pain.

## 2018-12-16 NOTE — Anesthesia Procedure Notes (Signed)
Procedure Name: LMA Insertion Date/Time: 12/16/2018 1:03 PM Performed by: Lavonia Dana, CRNA Pre-anesthesia Checklist: Patient identified, Emergency Drugs available, Suction available and Patient being monitored Patient Re-evaluated:Patient Re-evaluated prior to induction Oxygen Delivery Method: Circle system utilized Preoxygenation: Pre-oxygenation with 100% oxygen Induction Type: IV induction Ventilation: Mask ventilation without difficulty LMA: LMA inserted LMA Size: 4.0 Number of attempts: 1 Airway Equipment and Method: Bite block Placement Confirmation: positive ETCO2 Tube secured with: Tape Dental Injury: Teeth and Oropharynx as per pre-operative assessment

## 2018-12-16 NOTE — Anesthesia Procedure Notes (Addendum)
Anesthesia Regional Block: Popliteal block   Pre-Anesthetic Checklist: ,, timeout performed, Correct Patient, Correct Site, Correct Laterality, Correct Procedure, Correct Position, site marked, Risks and benefits discussed, pre-op evaluation,  At surgeon's request and post-op pain management  Laterality: Left  Prep: Maximum Sterile Barrier Precautions used, chloraprep       Needles:  Injection technique: Single-shot  Needle Type: Echogenic Needle     Needle Length: 9cm  Needle Gauge: 21     Additional Needles:   Procedures:,,,, ultrasound used (permanent image in chart),,,,  Narrative:  Start time: 12/16/2018 12:10 PM End time: 12/16/2018 12:19 PM Injection made incrementally with aspirations every 5 mL.  Performed by: Personally  Anesthesiologist: Barnet Glasgow, MD  Additional Notes: Block assessed. Patient tolerated procedure well.

## 2018-12-16 NOTE — Anesthesia Procedure Notes (Deleted)
Anesthesia Procedure Note     

## 2018-12-17 ENCOUNTER — Encounter (HOSPITAL_BASED_OUTPATIENT_CLINIC_OR_DEPARTMENT_OTHER): Payer: Self-pay | Admitting: Orthopedic Surgery

## 2018-12-17 ENCOUNTER — Other Ambulatory Visit: Payer: Self-pay | Admitting: Family

## 2018-12-17 MED FILL — AMLODIPINE BESYLATE 5 MG TA: 5 | 90 days supply | Qty: 90 | Fill #0

## 2018-12-29 DIAGNOSIS — S82852A Displaced trimalleolar fracture of left lower leg, initial encounter for closed fracture: Secondary | ICD-10-CM | POA: Diagnosis not present

## 2018-12-29 DIAGNOSIS — Z4889 Encounter for other specified surgical aftercare: Secondary | ICD-10-CM | POA: Diagnosis not present

## 2019-01-06 ENCOUNTER — Encounter: Payer: Self-pay | Admitting: Family

## 2019-01-06 MED ORDER — NITROFURANTOIN MONOHYD MACRO 100 MG PO CAPS: 100.0000 mg | ORAL_CAPSULE | Freq: Two times a day (BID) | ORAL | 0 refills | Status: DC

## 2019-01-06 MED FILL — NITROFURANTOIN MONO-MCR 100: 100 | 5 days supply | Qty: 10 | Fill #0

## 2019-01-06 NOTE — Telephone Encounter (Signed)
Spoke to pt. Advised her re: rx sent to [facility] and to call if symptoms fail to improve.

## 2019-01-27 DIAGNOSIS — S82852A Displaced trimalleolar fracture of left lower leg, initial encounter for closed fracture: Secondary | ICD-10-CM | POA: Diagnosis not present

## 2019-01-27 DIAGNOSIS — Z4889 Encounter for other specified surgical aftercare: Secondary | ICD-10-CM | POA: Diagnosis not present

## 2019-01-27 DIAGNOSIS — M25572 Pain in left ankle and joints of left foot: Secondary | ICD-10-CM | POA: Diagnosis not present

## 2019-02-24 DIAGNOSIS — M25572 Pain in left ankle and joints of left foot: Secondary | ICD-10-CM | POA: Diagnosis not present

## 2019-02-24 DIAGNOSIS — Z4889 Encounter for other specified surgical aftercare: Secondary | ICD-10-CM | POA: Diagnosis not present

## 2019-02-24 DIAGNOSIS — S82852A Displaced trimalleolar fracture of left lower leg, initial encounter for closed fracture: Secondary | ICD-10-CM | POA: Diagnosis not present

## 2019-02-27 ENCOUNTER — Other Ambulatory Visit: Payer: Self-pay

## 2019-02-27 ENCOUNTER — Ambulatory Visit (INDEPENDENT_AMBULATORY_CARE_PROVIDER_SITE_OTHER): Payer: 59 | Admitting: Rehabilitative and Restorative Service Providers"

## 2019-02-27 DIAGNOSIS — R2689 Other abnormalities of gait and mobility: Secondary | ICD-10-CM | POA: Diagnosis not present

## 2019-02-27 DIAGNOSIS — R600 Localized edema: Secondary | ICD-10-CM | POA: Diagnosis not present

## 2019-02-27 DIAGNOSIS — M25572 Pain in left ankle and joints of left foot: Secondary | ICD-10-CM | POA: Diagnosis not present

## 2019-02-27 DIAGNOSIS — M6281 Muscle weakness (generalized): Secondary | ICD-10-CM | POA: Diagnosis not present

## 2019-02-27 NOTE — Patient Instructions (Signed)
Access Code: [email or web address]  URL: [email or web address]  Date: 02/27/2019  Prepared by: Margretta Ditty   Exercises Long Sitting Calf Stretch with Strap - 3 reps - 1 sets - 30 seconds hold - 2x daily - 7x weekly Seated Self Great Toe Stretch - 3 reps - 1 sets - 30 seconds hold - 2x daily - 7x weekly sitting toe stretch - 3 reps - 1 sets - 30 hold - 2x daily - 7x weekly Seated Ankle Dorsiflexion AROM - 10 reps - 1 sets - 2x daily - 7x weekly Seated Ankle Plantarflexion AROM - 10 reps - 1 sets - 2x daily - 7x weekly Seated Toe Curl - 10 reps - 1 sets - 2x daily - 7x weekly Patient Education Scar Massage

## 2019-02-27 NOTE — Therapy (Signed)
[place], [place], [address] Phone: [phone]   Fax:  [phone]  Physical Therapy Evaluation  Patient Details  Name: Dominique Rice MRN: [id number] Date of Birth: February 03, 1960 Referring Provider (PT): Wylene Simmer, MD   Encounter Date: 02/27/2019  PT End of Session - 02/27/19 2026    Visit Number  1    Number of Visits  16    Date for PT Re-Evaluation  04/24/19    Authorization Type  [place] choice    PT Start Time  1020    PT Stop Time  1100    PT Time Calculation (min)  40 min    Activity Tolerance  Patient tolerated treatment well    Behavior During Therapy  [facility] for tasks assessed/performed       Past Medical History:  Diagnosis Date  . Hypertension   . Osteopenia   . Pneumonia     Past Surgical History:  Procedure Laterality Date  . ABDOMINAL HYSTERECTOMY    . CESAREAN SECTION     twice  . COLONOSCOPY  2014  . ENDOMETRIAL ABLATION  2005  . OOPHORECTOMY Left 1998  . ORIF ANKLE FRACTURE Left 12/16/2018   Procedure: OPEN REDUCTION INTERNAL FIXATION (ORIF) Left ankle trimalleolar fracture;  Surgeon: Wylene Simmer, MD;  Location: [facility];  Service: Orthopedics;  Laterality: Left;  6min  . TUBAL LIGATION  1998    There were no vitals filed for this visit.   Subjective Assessment - 02/27/19 1025    Subjective  The patient reports she had a fall 12/06/18 at the bottom of steps and fractured her L ankle (trimalleolar fracture).  She underwent surgery 12/16/18.  She had a repair of L ankle deltoid ligament and internal fixation.  She is using a walking boot and reports the doctor told her she can begin to advance into a shoe for short periods during the day.  At time of injury, you noted L great toe was numb without motion.    Patient Stated Goals  walk without boot, return to work (being on feet 12 hours)    Currently in Pain?  No/denies    Pain Location   Ankle    Pain Orientation  Left    Pain Descriptors / Indicators  Tightness         [facility] PT Assessment - 02/27/19 1032      Assessment   Medical Diagnosis  L ankle trimalleolar fracture, internal fixation and L deltoid ligament repair    Referring Provider (PT)  Wylene Simmer, MD    Onset Date/Surgical Date  12/06/18    Prior Therapy  none      Precautions   Precautions  Fall    Precaution Comments  due to injury, weakness L ankle    Required Braces or Orthoses  Other Brace/Splint    Other Brace/Splint  CAM walker      Restrictions   Weight Bearing Restrictions  Yes    LLE Weight Bearing  Weight bearing as tolerated    Other Position/Activity Restrictions  can use boot to progress walking and ready to begin walking in shoe short periods      Balance Screen   Has the patient fallen in the past 6 months  Yes    How many times?  1    Has the patient had a decrease in activity level because of a fear of falling?   Yes   due to  injury   Is the patient reluctant to leave their home because of a fear of falling?   No      Home Public house manager residence    Living Arrangements  Spouse/significant other    Home Access  Stairs to enter    Entrance Stairs-Number of Steps  2    Home Layout  Two level    Additional Comments  crutch to go up and down steps      Prior Function   Level of Independence  Independent      Observation/Other Assessments   Focus on Therapeutic Outcomes (FOTO)   54% (46% limited)      Observation/Other Assessments-Edema    Edema  Circumferential;Figure 8      Circumferential Edema   Circumferential - Right  20.5cm    Circumferential - Left   22 cm      Figure 8 Edema   Figure 8 - Right   49 cm    Figure 8 - Left   51 cm      Sensation   Light Touch  Impaired Detail    Light Touch Impaired Details  Impaired LLE    Additional Comments  numbness in L great toe and some numbness in other toes *to be further assessed.       Posture/Postural Control   Posture/Postural Control  No significant limitations    Posture Comments  Patient wears CAM walker for gait into clinic      ROM / Strength   AROM / PROM / Strength  AROM;PROM;Strength      AROM   Overall AROM   Deficits    Overall AROM Comments  No AROM great toe (unable to elicit contraction)    AROM Assessment Site  Ankle    Right/Left Ankle  Left    Left Ankle Dorsiflexion  -30    Left Ankle Plantar Flexion  150    Left Ankle Inversion  10    Left Ankle Eversion  5      PROM   Overall PROM   Deficits    Overall PROM Comments  30 degrees PROM great toe extension    PROM Assessment Site  Ankle    Right/Left Ankle  Left    Left Ankle Dorsiflexion  -21    Left Ankle Inversion  18    Left Ankle Eversion  12      Strength   Overall Strength Comments  *Limited ROM, unable to elicit motor contraction L great toe.  Patient is able to perform seated heel and toe raises demonstrating intact DF and PF.      Flexibility   Soft Tissue Assessment /Muscle Length  yes   shortening of left heel cord     Palpation   Palpation comment  scar tissue adhered medial and lateral ankle * instructed in scar tissue mobilization, numbness in toes      Ambulation/Gait   Ambulation/Gait  Yes    Ambulation/Gait Assistance  6: Modified independent (Device/Increase time)    Ambulation Distance (Feet)  60 Feet    Assistive device  None    Gait Comments  *further gait assessment without CAM walker to follow                Objective measurements completed on examination: See above findings.      [facility] Adult PT Treatment/Exercise - 02/27/19 1032      Self-Care   Self-Care  Other Self-Care Comments  Other Self-Care Comments   scar tissue massage      Exercises   Exercises  Ankle      Ankle Exercises: Stretches   Gastroc Stretch  2 reps;30 seconds      Ankle Exercises: Seated   Heel Raises  10 reps    Toe Raise  10 reps    Other Seated Ankle Exercises   great toe extension PROM, seated toe extension ROM             PT Education - 02/27/19 1102    Education Details  HEP    Person(s) Educated  Patient    Methods  Explanation;Demonstration;Handout    Comprehension  Verbalized understanding;Returned demonstration       PT Short Term Goals - 02/27/19 2031      PT SHORT TERM GOAL #1   Title  The patient will be indep with initial HEP.    Time  4    Period  Weeks    Target Date  03/29/19      PT SHORT TERM GOAL #2   Title  The patient will improve AROM left ankle to be -10 degrees from neutral DF.    Time  4    Period  Weeks    Target Date  03/29/19      PT SHORT TERM GOAL #3   Title  The patient will ambulate in her home without cam walker independently per report.    Time  4    Period  Weeks    Target Date  03/29/19      PT SHORT TERM GOAL #4   Title  The patient will be further assessed on stairs and gait speed and LTGs to follow.    Time  4    Period  Weeks    Target Date  03/29/19      PT SHORT TERM GOAL #5   Title  The patient will return demo scar tissue mobilization.    Time  4    Period  Weeks    Target Date  03/29/19        PT Long Term Goals - 02/27/19 2045      PT LONG TERM GOAL #1   Title  The patient will be indep with progression of HEP.    Time  8    Period  Weeks    Target Date  04/24/19      PT LONG TERM GOAL #2   Title  The patient will reduce limitation per FOTO from 46% down to 25%.    Time  8    Period  Weeks    Target Date  04/24/19      PT LONG TERM GOAL #3   Title  The patient will improve L ankle AROM to neutral position of DF.    Time  8    Period  Weeks    Target Date  04/24/19      PT LONG TERM GOAL #4   Title  The patient will negotiate steps with reciprocal pattern independently.    Time  8    Period  Weeks    Target Date  04/24/19      PT LONG TERM GOAL #5   Title  The patient will be able to tolerate prolonged standing > 2 hours to demo ability to be on her  feet x hours at work.    Time  8    Period  Weeks    Target Date  04/24/19             Plan - 02/27/19 2048    Clinical Impression Statement  The patient is a 59 year old female s/p fall 12/06/2018 and surgery 12/16/2018 for a trimalleolar left ankle fracture.  She presents to outpatient physical therapy [facility] with Cam walker donned (able to begin to transition out of to tolerance per her report).  She has impairments of decreased AROM/PROM L ankle for DF/PF/eversion/inversion, decreased strength all planes L ankle, decreased motor control L great toe extension, decreased soft tissue mobilization, tightness/dec'd flexibility L heel cord, sensory changes L ankle, abnormality of gait due to ankle deficits.  Impairments lead to limited functional abilities of dec'd standing tolerance necessary for return to work, decreased gait for household and community distances.  PT to address deficits to promote return to prior level of function.    Examination-Activity Limitations  Stairs;Stand;Locomotion Level    Examination-Participation Restrictions  Community Activity    Stability/Clinical Decision Making  Stable/Uncomplicated    Clinical Decision Making  Low    Rehab Potential  Good    PT Frequency  2x / week    PT Duration  8 weeks    PT Treatment/Interventions  ADLs/Self Care Home Management;Iontophoresis 4mg /ml Dexamethasone;Ultrasound;Cryotherapy;Electrical Stimulation;Moist Heat;Balance training;Therapeutic activities;Therapeutic exercise;Functional mobility training;Gait training;Stair training;Patient/family education;Taping;Manual techniques;Passive range of motion;Dry needling;Vasopneumatic Device    PT Next Visit Plan  soft tissue mobilization heel cord, scar massage, passive stretching, strengthening, weight bearing/weight shifting, gait speed assessment, stair assessment.    PT Home Exercise Plan  Access Code: [email or web address]    Consulted and Agree with Plan of Care  Patient       Patient will  benefit from skilled therapeutic intervention in order to improve the following deficits and impairments:  Abnormal gait, Decreased range of motion, Difficulty walking, Decreased activity tolerance, Hypomobility, Impaired flexibility, Pain, Decreased scar mobility, Decreased strength, Impaired sensation  Visit Diagnosis: Pain in left ankle and joints of left foot  Muscle weakness (generalized)  Other abnormalities of gait and mobility  Localized edema     Problem List Patient Active Problem List   Diagnosis Date Noted  . Hypertension 10/02/2016  . Enlarged lymph node 07/02/2014  . Osteopenia 10/20/2012  . Routine general medical examination at a health care facility 09/29/2012    Dominique Rice, PT 02/27/2019, 8:58 PM  [facility] [address] [place], [place], [place] Phone: [phone]   Fax:  [phone]  Name: Dominique Rice MRN: [id number] Date of Birth: 30-Jan-1960

## 2019-03-04 ENCOUNTER — Ambulatory Visit (INDEPENDENT_AMBULATORY_CARE_PROVIDER_SITE_OTHER): Payer: 59 | Admitting: Physical Therapy

## 2019-03-04 ENCOUNTER — Other Ambulatory Visit: Payer: Self-pay

## 2019-03-04 ENCOUNTER — Encounter: Payer: Self-pay | Admitting: Physical Therapy

## 2019-03-04 DIAGNOSIS — M6281 Muscle weakness (generalized): Secondary | ICD-10-CM | POA: Diagnosis not present

## 2019-03-04 DIAGNOSIS — M25572 Pain in left ankle and joints of left foot: Secondary | ICD-10-CM

## 2019-03-04 DIAGNOSIS — R2689 Other abnormalities of gait and mobility: Secondary | ICD-10-CM

## 2019-03-04 DIAGNOSIS — R600 Localized edema: Secondary | ICD-10-CM

## 2019-03-04 NOTE — Patient Instructions (Addendum)
TOES: Towel Bunching    With involved straight toes on towel, bend toes bunching up towel __10_ times. Do __1_ times per day.  Forefoot Invertors    Place right foot flat on towel, knee pointed forward. Use forefoot and toes to pull towel in toward center. Do not allow heel or knee to move. Hold _2___ seconds. Repeat _10___ times. Do __1__ sessions per day. CAUTION: Repetitions should be slow and controlled.  Forefoot Evertors    Place right foot flat on towel, knee pointed forward. Use forefoot and toes to push towel out to side. Do not allow heel or knee to move. Hold _2___ seconds. Repeat _10___ times. Do __1__ sessions per day. CAUTION: Repetitions should be slow and controlled.  Access Code: [email or web address]  URL: [email or web address]  Date: 03/04/2019  Prepared by: Mayer Camel   Exercises  Long Sitting Calf Stretch with Strap - 3 reps - 1 sets - 30 seconds hold - 2x daily - 7x weekly  Seated Self Great Toe Stretch - 3 reps - 1 sets - 30 seconds hold - 2x daily - 7x weekly  sitting toe stretch - 3 reps - 1 sets - 30 hold - 2x daily - 7x weekly  Seated Ankle Dorsiflexion AROM - 10 reps - 1 sets - 2x daily - 7x weekly  Seated Ankle Plantarflexion AROM - 10 reps - 1 sets - 2x daily - 7x weekly  Seated Ankle Circles - 10 reps - 1 sets - 2x daily - 7x weekly  Seated Heel Slide - 5-10 reps - 1 sets - 5 hold - 1x daily - 7x weekly  Hooklying Hamstring Stretch with Strap - 2-3 reps - 30 hold - 1x daily - 7x weekly  Prone Quadriceps Stretch with Strap - 2-3 reps - 30 hold - 1x daily - 7x weekly  Seated Table Piriformis Stretch - 2-3 reps - 30 hold - 1x daily - 7x weekly

## 2019-03-04 NOTE — Therapy (Signed)
[place], [place], [address] Phone: [phone]   Fax:  [phone]  Physical Therapy Treatment  Patient Details  Name: Dominique Rice MRN: [id number] Date of Birth: 06-May-1960 Referring Provider (PT): Wylene Simmer, MD   Encounter Date: 03/04/2019  PT End of Session - 03/04/19 1436    Visit Number  2    Number of Visits  16    Date for PT Re-Evaluation  04/24/19    Authorization Type  [place] choice    PT Start Time  1433    PT Stop Time  1520    PT Time Calculation (min)  47 min    Activity Tolerance  Patient tolerated treatment well    Behavior During Therapy  [facility] for tasks assessed/performed       Past Medical History:  Diagnosis Date  . Hypertension   . Osteopenia   . Pneumonia     Past Surgical History:  Procedure Laterality Date  . ABDOMINAL HYSTERECTOMY    . CESAREAN SECTION     twice  . COLONOSCOPY  2014  . ENDOMETRIAL ABLATION  2005  . OOPHORECTOMY Left 1998  . ORIF ANKLE FRACTURE Left 12/16/2018   Procedure: OPEN REDUCTION INTERNAL FIXATION (ORIF) Left ankle trimalleolar fracture;  Surgeon: Wylene Simmer, MD;  Location: [place];  Service: Orthopedics;  Laterality: Left;  35min  . TUBAL LIGATION  1998    There were no vitals filed for this visit.  Subjective Assessment - 03/04/19 1437    Subjective  Pt reports she can tell the massage and exercises are helping loosen her ankle.   Lt great toe is still tingling / "floppy".    Patient Stated Goals  walk without boot, return to work (being on feet 12 hours)    Currently in Pain?  No/denies         [facility] PT Assessment - 03/04/19 0001      Assessment   Medical Diagnosis  L ankle trimalleolar fracture, internal fixation and L deltoid ligament repair    Referring Provider (PT)  Wylene Simmer, MD    Onset Date/Surgical Date  12/06/18    Prior Therapy  none      Restrictions   LLE Weight Bearing   Weight bearing as tolerated      [facility] Adult PT Treatment/Exercise - 03/04/19 0001      Self-Care   Other Self-Care Comments   reviewed scar massage with pt.        Modalities   Modalities  Vasopneumatic      Vasopneumatic   Number Minutes Vasopneumatic   10 minutes    Vasopnuematic Location   Ankle   Lt    Vasopneumatic Pressure  Medium    Vasopneumatic Temperature   34 deg       Manual Therapy   Manual Therapy  Taping    Kinesiotex  Facilitate Muscle;Create Space      Kinesiotix   Create Space  I strip of reg Rock tape applied over incisions in zigzag pattern for scar managment.       Facilitate Muscle   I strip of Reg Rock tape applied to dorsal surface of Lt great toe to facilitate muscle.        Ankle Exercises: Aerobic   Nustep  L4: 5 min, range to tolerance, arms to help with stretch      Ankle Exercises: Stretches   Soleus Stretch  2 reps;30 seconds  long sitting with strap   Gastroc Stretch  2 reps;30 seconds   long sitting with strap   Other Stretch  Lt heel slides x 5 sec hold x 5 reps     Other Stretch  Lt piriformis (modified pigeon pose at table x 2 reps of 15 sec on LLE); Lt prone quad stretch x 2 reps of 30 sec, Lt hamstring stretch in hooklying x 30 sec x 2 reps       Ankle Exercises: Seated   Ankle Circles/Pumps  AROM;Left;10 reps    Towel Crunch  2 reps    Towel Inversion/Eversion  2 reps   shoe on towel.   Heel Raises  10 reps;Both    Toe Raise  10 reps    Other Seated Ankle Exercises   seated toe extension ROM x 5 reps               PT Short Term Goals - 02/27/19 2031      PT SHORT TERM GOAL #1   Title  The patient will be indep with initial HEP.    Time  4    Period  Weeks    Target Date  03/29/19      PT SHORT TERM GOAL #2   Title  The patient will improve AROM left ankle to be -10 degrees from neutral DF.    Time  4    Period  Weeks    Target Date  03/29/19      PT SHORT TERM GOAL #3   Title  The patient will ambulate in  her home without cam walker independently per report.    Time  4    Period  Weeks    Target Date  03/29/19      PT SHORT TERM GOAL #4   Title  The patient will be further assessed on stairs and gait speed and LTGs to follow.    Time  4    Period  Weeks    Target Date  03/29/19      PT SHORT TERM GOAL #5   Title  The patient will return demo scar tissue mobilization.    Time  4    Period  Weeks    Target Date  03/29/19        PT Long Term Goals - 02/27/19 2045      PT LONG TERM GOAL #1   Title  The patient will be indep with progression of HEP.    Time  8    Period  Weeks    Target Date  04/24/19      PT LONG TERM GOAL #2   Title  The patient will reduce limitation per FOTO from 46% down to 25%.    Time  8    Period  Weeks    Target Date  04/24/19      PT LONG TERM GOAL #3   Title  The patient will improve L ankle AROM to neutral position of DF.    Time  8    Period  Weeks    Target Date  04/24/19      PT LONG TERM GOAL #4   Title  The patient will negotiate steps with reciprocal pattern independently.    Time  8    Period  Weeks    Target Date  04/24/19      PT LONG TERM GOAL #5   Title  The patient will be able to tolerate prolonged standing >  2 hours to demo ability to be on her feet x hours at work.    Time  8    Period  Weeks    Target Date  04/24/19            Plan - 03/04/19 1527    Clinical Impression Statement  Pt demonstrating improved Lt ankle ROM.  She tolerated all exercises well, without increase in pain.  Limited Lt great toe contraction into ext.  Progressing towards goals.    Examination-Activity Limitations  Stairs;Stand;Locomotion Level    Examination-Participation Restrictions  Community Activity    Stability/Clinical Decision Making  Stable/Uncomplicated    Rehab Potential  Good    PT Frequency  2x / week    PT Duration  8 weeks    PT Treatment/Interventions  ADLs/Self Care Home Management;Iontophoresis 4mg /ml  Dexamethasone;Ultrasound;Cryotherapy;Electrical Stimulation;Moist Heat;Balance training;Therapeutic activities;Therapeutic exercise;Functional mobility training;Gait training;Stair training;Patient/family education;Taping;Manual techniques;Passive range of motion;Dry needling;Vasopneumatic Device    PT Next Visit Plan  IASTM, weight shifts, stairs, BAPS.    PT Home Exercise Plan  Access Code: [email or web address]    Consulted and Agree with Plan of Care  Patient       Patient will benefit from skilled therapeutic intervention in order to improve the following deficits and impairments:  Abnormal gait, Decreased range of motion, Difficulty walking, Decreased activity tolerance, Hypomobility, Impaired flexibility, Pain, Decreased scar mobility, Decreased strength, Impaired sensation  Visit Diagnosis: Pain in left ankle and joints of left foot  Muscle weakness (generalized)  Other abnormalities of gait and mobility  Localized edema     Problem List Patient Active Problem List   Diagnosis Date Noted  . Hypertension 10/02/2016  . Enlarged lymph node 07/02/2014  . Osteopenia 10/20/2012  . Routine general medical examination at a health care facility 09/29/2012   10/01/2012, PTA 03/04/19 3:32 PM  [facility] [place] [address] [place], [place], [place] Phone: [phone]   Fax:  [phone]  Name: Dominique Rice MRN: Mickel Crow Date of Birth: 04-07-1960

## 2019-03-06 ENCOUNTER — Ambulatory Visit (INDEPENDENT_AMBULATORY_CARE_PROVIDER_SITE_OTHER): Payer: 59 | Admitting: Physical Therapy

## 2019-03-06 ENCOUNTER — Other Ambulatory Visit: Payer: Self-pay

## 2019-03-06 DIAGNOSIS — M25572 Pain in left ankle and joints of left foot: Secondary | ICD-10-CM | POA: Diagnosis not present

## 2019-03-06 DIAGNOSIS — M6281 Muscle weakness (generalized): Secondary | ICD-10-CM

## 2019-03-06 DIAGNOSIS — R2689 Other abnormalities of gait and mobility: Secondary | ICD-10-CM

## 2019-03-06 DIAGNOSIS — R600 Localized edema: Secondary | ICD-10-CM

## 2019-03-06 NOTE — Therapy (Signed)
[place], [place], [address] Phone: [phone]   Fax:  [phone]  Physical Therapy Treatment  Patient Details  Name: Dominique Rice MRN: [id number] Date of Birth: 07/10/1960 Referring Provider (PT): Wylene Simmer, MD   Encounter Date: 03/06/2019  PT End of Session - 03/06/19 1152    Visit Number  3    Number of Visits  16    Date for PT Re-Evaluation  04/24/19    Authorization Type  [place] choice    PT Start Time  1149    PT Stop Time  1234    PT Time Calculation (min)  45 min    Activity Tolerance  Patient tolerated treatment well    Behavior During Therapy  [facility] for tasks assessed/performed       Past Medical History:  Diagnosis Date  . Hypertension   . Osteopenia   . Pneumonia     Past Surgical History:  Procedure Laterality Date  . ABDOMINAL HYSTERECTOMY    . CESAREAN SECTION     twice  . COLONOSCOPY  2014  . ENDOMETRIAL ABLATION  2005  . OOPHORECTOMY Left 1998  . ORIF ANKLE FRACTURE Left 12/16/2018   Procedure: OPEN REDUCTION INTERNAL FIXATION (ORIF) Left ankle trimalleolar fracture;  Surgeon: Wylene Simmer, MD;  Location: [place];  Service: Orthopedics;  Laterality: Left;  52mn  . TUBAL LIGATION  1998    There were no vitals filed for this visit.  Subjective Assessment - 03/06/19 1151    Subjective  Pt reports she was sore yesterday, a little better today. HEP going well.  She feels very guarded and slow when she is walking without boot.    Patient Stated Goals  walk without boot, return to work (being on feet 12 hours)    Currently in Pain?  Yes    Pain Score  3     Pain Location  Ankle    Pain Orientation  Left;Medial;Posterior    Pain Descriptors / Indicators  Sore;Tightness         [facility] PT Assessment - 03/06/19 0001      Assessment   Medical Diagnosis  L ankle trimalleolar fracture, internal fixation and L deltoid ligament repair     Referring Provider (PT)  JWylene Simmer MD    Onset Date/Surgical Date  12/06/18    Next MD Visit  03/23/19    Prior Therapy  none      Restrictions   LLE Weight Bearing  Weight bearing as tolerated    Other Position/Activity Restrictions  can use boot to progress walking and ready to begin walking in shoe short periods      AROM   Right/Left Ankle  Left    Left Ankle Dorsiflexion  0    Left Ankle Plantar Flexion  52    Left Ankle Inversion  17    Left Ankle Eversion  6      [facility] Adult PT Treatment/Exercise - 03/06/19 0001      Vasopneumatic   Number Minutes Vasopneumatic   10 minutes    Vasopnuematic Location   Ankle   Lt    Vasopneumatic Pressure  Medium    Vasopneumatic Temperature   34 deg       Manual Therapy   Kinesiotex  Facilitate Muscle;Create Space      Kinesiotix   Create Space  I strip of reg Rock tape applied over incisions in zigzag pattern for scar managment.  Facilitate Muscle   I strip of Reg Rock tape applied to dorsal surface of Lt great toe to facilitate muscle.        Ankle Exercises: Aerobic   Nustep  L4: 5 min, range to tolerance for warm up.       Ankle Exercises: Seated   Ankle Circles/Pumps  AROM;Left;10 reps    Heel Raises  10 reps;Both    Toe Raise  10 reps    BAPS  Sitting;Level 3;10 reps   PF/DF, inv/eversion, CW /CCW   Other Seated Ankle Exercises  AROM inversion/eversion x 15 reps      Ankle Exercises: Stretches   Soleus Stretch  30 seconds;3 reps   long sitting with strap   Gastroc Stretch  2 reps;20 seconds   both, incline board -L2     Ankle Exercises: Standing   Other Standing Ankle Exercises  forward step up with LLE 6" step and retro step down with RLE, BUE support on rails.  Attempted to step down 3" step with RLE; unable to tolerate.    Forward weight shift onto scale - able to stand 100% WB on LLE, into SLS x ~5 sec              PT Short Term Goals - 03/06/19 1232      PT SHORT TERM GOAL #1   Title  The  patient will be indep with initial HEP.    Time  4    Period  Weeks    Status  On-going    Target Date  03/29/19      PT SHORT TERM GOAL #2   Title  The patient will improve AROM left ankle to be -10 degrees from neutral DF.    Time  4    Period  Weeks    Status  Achieved    Target Date  03/29/19      PT SHORT TERM GOAL #3   Title  The patient will ambulate in her home without cam walker independently per report.    Time  4    Period  Weeks    Status  Achieved    Target Date  03/29/19      PT SHORT TERM GOAL #4   Title  The patient will be further assessed on stairs and gait speed and LTGs to follow.    Time  4    Period  Weeks    Status  On-going    Target Date  03/29/19      PT SHORT TERM GOAL #5   Title  The patient will return demo scar tissue mobilization.    Time  4    Period  Weeks    Status  Achieved    Target Date  03/29/19        PT Long Term Goals - 02/27/19 2045      PT LONG TERM GOAL #1   Title  The patient will be indep with progression of HEP.    Time  8    Period  Weeks    Target Date  04/24/19      PT LONG TERM GOAL #2   Title  The patient will reduce limitation per FOTO from 46% down to 25%.    Time  8    Period  Weeks    Target Date  04/24/19      PT LONG TERM GOAL #3   Title  The patient will improve L ankle AROM to neutral  position of DF.    Time  8    Period  Weeks    Target Date  04/24/19      PT LONG TERM GOAL #4   Title  The patient will negotiate steps with reciprocal pattern independently.    Time  8    Period  Weeks    Target Date  04/24/19      PT LONG TERM GOAL #5   Title  The patient will be able to tolerate prolonged standing > 2 hours to demo ability to be on her feet x hours at work.    Time  8    Period  Weeks    Target Date  04/24/19            Plan - 03/06/19 1240    Clinical Impression Statement  Good improvement in Lt ankle AROM.  She tolerated L3 with seated BAPS board well, without difficulty.   Unable to descend any stairs with RLE due to pain with this motion.  She has met STG #2, 3, and 5.    Examination-Activity Limitations  Stairs;Stand;Locomotion Level    Examination-Participation Restrictions  Community Activity    Stability/Clinical Decision Making  Stable/Uncomplicated    Rehab Potential  Good    PT Frequency  2x / week    PT Duration  8 weeks    PT Treatment/Interventions  ADLs/Self Care Home Management;Iontophoresis 54m/ml Dexamethasone;Ultrasound;Cryotherapy;Electrical Stimulation;Moist Heat;Balance training;Therapeutic activities;Therapeutic exercise;Functional mobility training;Gait training;Stair training;Patient/family education;Taping;Manual techniques;Passive range of motion;Dry needling;Vasopneumatic Device    PT Next Visit Plan  IASTM, weight shifts, stairs, BAPS.    PT Home Exercise Plan  Access Code: [email or web address]    Consulted and Agree with Plan of Care  Patient       Patient will benefit from skilled therapeutic intervention in order to improve the following deficits and impairments:  Abnormal gait, Decreased range of motion, Difficulty walking, Decreased activity tolerance, Hypomobility, Impaired flexibility, Pain, Decreased scar mobility, Decreased strength, Impaired sensation  Visit Diagnosis: Pain in left ankle and joints of left foot  Muscle weakness (generalized)  Other abnormalities of gait and mobility  Localized edema     Problem List Patient Active Problem List   Diagnosis Date Noted  . Hypertension 10/02/2016  . Enlarged lymph node 07/02/2014  . Osteopenia 10/20/2012  . Routine general medical examination at a health care facility 09/29/2012   JKerin Perna PTA 03/06/19 12:50 PM  [place] [address]Phone: [phone]  Fax:  [phone] Name: JMarysa WessnerMRN: [id number] of Birth: 901/12/1960

## 2019-03-10 ENCOUNTER — Other Ambulatory Visit: Payer: Self-pay

## 2019-03-10 ENCOUNTER — Ambulatory Visit (INDEPENDENT_AMBULATORY_CARE_PROVIDER_SITE_OTHER): Payer: 59 | Admitting: Physical Therapy

## 2019-03-10 DIAGNOSIS — R600 Localized edema: Secondary | ICD-10-CM

## 2019-03-10 DIAGNOSIS — M25572 Pain in left ankle and joints of left foot: Secondary | ICD-10-CM

## 2019-03-10 DIAGNOSIS — M6281 Muscle weakness (generalized): Secondary | ICD-10-CM | POA: Diagnosis not present

## 2019-03-10 DIAGNOSIS — R2689 Other abnormalities of gait and mobility: Secondary | ICD-10-CM

## 2019-03-10 NOTE — Therapy (Signed)
[place], [place], [address] Phone: [phone]   Fax:  [phone]  Physical Therapy Treatment  Patient Details  Name: Dominique Rice MRN: [id number] Date of Birth: 1960-07-19 Referring Provider (PT): Wylene Simmer, MD   Encounter Date: 03/10/2019  PT End of Session - 03/10/19 1021    Visit Number  4    Number of Visits  16    Date for PT Re-Evaluation  04/24/19    Authorization Type   choice    PT Start Time  1019    PT Stop Time  1105    PT Time Calculation (min)  46 min    Activity Tolerance  Patient tolerated treatment well    Behavior During Therapy  [facility] for tasks assessed/performed       Past Medical History:  Diagnosis Date  . Hypertension   . Osteopenia   . Pneumonia     Past Surgical History:  Procedure Laterality Date  . ABDOMINAL HYSTERECTOMY    . CESAREAN SECTION     twice  . COLONOSCOPY  2014  . ENDOMETRIAL ABLATION  2005  . OOPHORECTOMY Left 1998  . ORIF ANKLE FRACTURE Left 12/16/2018   Procedure: OPEN REDUCTION INTERNAL FIXATION (ORIF) Left ankle trimalleolar fracture;  Surgeon: Wylene Simmer, MD;  Location: [place];  Service: Orthopedics;  Laterality: Left;  15min  . TUBAL LIGATION  1998    There were no vitals filed for this visit.  Subjective Assessment - 03/10/19 1021    Subjective  Pt reports continued tightness in medial/posterior Lt ankle.  She is wearing boot in community, but not household. She has bought some tape and applied to her ankle. She complains of conitnued numbness in toes; wearing a bigger shoe is helping her foot feel not "so squished".    Currently in Pain?  Yes    Pain Score  3     Pain Location  Ankle    Pain Orientation  Left;Medial;Posterior    Pain Descriptors / Indicators  Tightness;Sore    Aggravating Factors   walking, getting up after sitting    Pain Relieving Factors  ?         [facility] PT  Assessment - 03/10/19 0001      Assessment   Medical Diagnosis  L ankle trimalleolar fracture, internal fixation and L deltoid ligament repair    Referring Provider (PT)  Wylene Simmer, MD    Onset Date/Surgical Date  12/06/18    Next MD Visit  03/23/19    Prior Therapy  none       [facility] Adult PT Treatment/Exercise - 03/10/19 0001      Ambulation/Gait   Ambulation/Gait  Yes    Ambulation/Gait Assistance  6: Modified independent (Device/Increase time)    Ambulation Distance (Feet)  200 Feet    Assistive device  Straight cane    Gait Pattern  Decreased arm swing - left;Step-through pattern;Decreased stance time - left;Decreased dorsiflexion - left    Pre-Gait Activities  slowed speed.       Vasopneumatic   Number Minutes Vasopneumatic   10 minutes    Vasopnuematic Location   Ankle   Lt    Vasopneumatic Pressure  Medium    Vasopneumatic Temperature   34 deg       Manual Therapy   Manual Therapy  Soft tissue mobilization    Soft tissue mobilization  IASTM and STM to med/lat Achilles tendon and calf to  decrease fascial restrictions and improve ROM.     Kinesiotex  --   pt had tape on ankle already.      Ankle Exercises: Aerobic   Nustep  L5: 5 min, range to tolerance for warm up.       Ankle Exercises: Seated   BAPS  Sitting;Level 3;Level 4;5 reps   PF/DF, inv/eversion, CW /CCW   Other Seated Ankle Exercises  sit to/from stand x 5 reps, cues for even weight (Lt foot slightly forward)      Ankle Exercises: Stretches   Soleus Stretch  3 reps;20 seconds   standing, gentle   Gastroc Stretch  2 reps;20 seconds   standing, runner's stretch      Ankle Exercises: Standing   Other Standing Ankle Exercises  forward step up on 6" with LLE x 10 reps; lateral step ups on 3" step x 5 reps                PT Short Term Goals - 03/06/19 1232      PT SHORT TERM GOAL #1   Title  The patient will be indep with initial HEP.    Time  4    Period  Weeks    Status  On-going     Target Date  03/29/19      PT SHORT TERM GOAL #2   Title  The patient will improve AROM left ankle to be -10 degrees from neutral DF.    Time  4    Period  Weeks    Status  Achieved    Target Date  03/29/19      PT SHORT TERM GOAL #3   Title  The patient will ambulate in her home without cam walker independently per report.    Time  4    Period  Weeks    Status  Achieved    Target Date  03/29/19      PT SHORT TERM GOAL #4   Title  The patient will be further assessed on stairs and gait speed and LTGs to follow.    Time  4    Period  Weeks    Status  On-going    Target Date  03/29/19      PT SHORT TERM GOAL #5   Title  The patient will return demo scar tissue mobilization.    Time  4    Period  Weeks    Status  Achieved    Target Date  03/29/19        PT Long Term Goals - 02/27/19 2045      PT LONG TERM GOAL #1   Title  The patient will be indep with progression of HEP.    Time  8    Period  Weeks    Target Date  04/24/19      PT LONG TERM GOAL #2   Title  The patient will reduce limitation per FOTO from 46% down to 25%.    Time  8    Period  Weeks    Target Date  04/24/19      PT LONG TERM GOAL #3   Title  The patient will improve L ankle AROM to neutral position of DF.    Time  8    Period  Weeks    Target Date  04/24/19      PT LONG TERM GOAL #4   Title  The patient will negotiate steps with reciprocal pattern independently.  Time  8    Period  Weeks    Target Date  04/24/19      PT LONG TERM GOAL #5   Title  The patient will be able to tolerate prolonged standing > 2 hours to demo ability to be on her feet x hours at work.    Time  8    Period  Weeks    Target Date  04/24/19            Plan - 03/10/19 1210    Clinical Impression Statement  Pt able to tolerate L4 on BAPS board well.  She was able to descend 3" step laterally, but not forward yet.  AROM in Lt ankle gradually improving. Pt's gait pattern much more fluid with use of SPC in  clinic; gait speed to be assessed at future visit.   Encouraged pt to massage Achilles/calf, perform AROM in ankle before standing, and use single crutch for gait without boot to improve gait pattern.    Examination-Activity Limitations  Stairs;Stand;Locomotion Level    Examination-Participation Restrictions  Community Activity    Stability/Clinical Decision Making  Stable/Uncomplicated    Rehab Potential  Good    PT Frequency  2x / week    PT Duration  8 weeks    PT Treatment/Interventions  ADLs/Self Care Home Management;Iontophoresis 4mg /ml Dexamethasone;Ultrasound;Cryotherapy;Electrical Stimulation;Moist Heat;Balance training;Therapeutic activities;Therapeutic exercise;Functional mobility training;Gait training;Stair training;Patient/family education;Taping;Manual techniques;Passive range of motion;Dry needling;Vasopneumatic Device    PT Next Visit Plan  BAPS, IASTM, gait speed.    PT Home Exercise Plan  Access Code: [email or web address]    Consulted and Agree with Plan of Care  Patient       Patient will benefit from skilled therapeutic intervention in order to improve the following deficits and impairments:  Abnormal gait, Decreased range of motion, Difficulty walking, Decreased activity tolerance, Hypomobility, Impaired flexibility, Pain, Decreased scar mobility, Decreased strength, Impaired sensation  Visit Diagnosis: Pain in left ankle and joints of left foot  Muscle weakness (generalized)  Other abnormalities of gait and mobility  Localized edema     Problem List Patient Active Problem List   Diagnosis Date Noted  . Hypertension 10/02/2016  . Enlarged lymph node 07/02/2014  . Osteopenia 10/20/2012  . Routine general medical examination at a health care facility 09/29/2012   10/01/2012, PTA 03/10/19 12:13 PM  [facility] [place] [address] [place], [place], [place] Phone: [phone]   Fax:  [phone]  Name:  Dominique Rice MRN: Mickel Crow Date of Birth: [birth year]

## 2019-03-13 ENCOUNTER — Other Ambulatory Visit: Payer: Self-pay

## 2019-03-13 ENCOUNTER — Ambulatory Visit (INDEPENDENT_AMBULATORY_CARE_PROVIDER_SITE_OTHER): Payer: 59 | Admitting: Rehabilitative and Restorative Service Providers"

## 2019-03-13 ENCOUNTER — Encounter: Payer: Self-pay | Admitting: Rehabilitative and Restorative Service Providers"

## 2019-03-13 DIAGNOSIS — M6281 Muscle weakness (generalized): Secondary | ICD-10-CM

## 2019-03-13 DIAGNOSIS — M25572 Pain in left ankle and joints of left foot: Secondary | ICD-10-CM

## 2019-03-13 DIAGNOSIS — R2689 Other abnormalities of gait and mobility: Secondary | ICD-10-CM | POA: Diagnosis not present

## 2019-03-13 DIAGNOSIS — R600 Localized edema: Secondary | ICD-10-CM | POA: Diagnosis not present

## 2019-03-13 NOTE — Therapy (Signed)
[facility] [address] [place], [place], [address] Phone: [phone]   Fax:  [phone]  Physical Therapy Treatment  Patient Details  Name: Dominique Rice MRN: [id number] Date of Birth: 1960/02/04 Referring Provider (PT): Toni Arthurs, MD   Encounter Date: 03/13/2019  PT End of Session - 03/13/19 1141    Visit Number  5    Number of Visits  16    Date for PT Re-Evaluation  04/24/19    Authorization Type  [employer] choice    PT Start Time  1016    PT Stop Time  1105    PT Time Calculation (min)  49 min    Activity Tolerance  Patient tolerated treatment well    Behavior During Therapy  [facility] for tasks assessed/performed       Past Medical History:  Diagnosis Date  . Hypertension   . Osteopenia   . Pneumonia     Past Surgical History:  Procedure Laterality Date  . ABDOMINAL HYSTERECTOMY    . CESAREAN SECTION     twice  . COLONOSCOPY  2014  . ENDOMETRIAL ABLATION  2005  . OOPHORECTOMY Left 1998  . ORIF ANKLE FRACTURE Left 12/16/2018   Procedure: OPEN REDUCTION INTERNAL FIXATION (ORIF) Left ankle trimalleolar fracture;  Surgeon: Toni Arthurs, MD;  Location: [facility];  Service: Orthopedics;  Laterality: Left;   . TUBAL LIGATION  1998    There were no vitals filed for this visit.  Subjective Assessment - 03/13/19 1017    Subjective  The patient arrives walking with one axillary crutch and no boot.    Patient Stated Goals  walk without boot, return to work (being on feet 12 hours)    Currently in Pain?  Yes    Pain Location  Ankle    Pain Orientation  Left;Medial;Posterior    Pain Descriptors / Indicators  Tightness;Sore    Pain Type  Acute pain    Pain Onset  More than a month ago    Pain Frequency  Constant    Aggravating Factors   walking                       [facility] Adult PT Treatment/Exercise - 03/13/19 1027      Ambulation/Gait   Ambulation/Gait  Yes     Ambulation/Gait Assistance  6: Modified independent (Device/Increase time)    Ambulation Distance (Feet)  150 Feet   x 3   Assistive device  Straight cane    Gait Comments  Gait training emphasizing equal weight shift left to right and maintaining hip extension as she loads into mid stance phase      Exercises   Exercises  Ankle      Vasopneumatic   Number Minutes Vasopneumatic   10 minutes    Vasopnuematic Location   Ankle    Vasopneumatic Pressure  Medium    Vasopneumatic Temperature   34 deg       Manual Therapy   Manual Therapy  Soft tissue mobilization;Taping    Manual therapy comments  to increase mobility, reduce pain    Soft tissue mobilization  IASTM and STM soleous, gastroc, med/lat achilles tendon    Kinesiotex  Facilitate Muscle;Create Space      Kinesiotix   Create Space  I strip of reg Rock tape applied over incisions in zigzag pattern for scar managment.       Facilitate Muscle   I strip  of Reg Rock tape applied to dorsal surface of Lt great toe to facilitate muscle.        Ankle Exercises: Stretches   Slant Board Stretch  2 reps;30 seconds    Slant Board Stretch Limitations  level 2 with cues to avoid trunk flexion      Ankle Exercises: Aerobic   Nustep  L4 x 4 minutes LEs only, range to tolerance      Ankle Exercises: Standing   Other Standing Ankle Exercises  Weight shifting standing with L foot posterior in stride working on rolling into terminal stance with emphasis on great toe extension, then reverse direction with L foot forward in stride emphasizing L knee extension during loading to mid stance and ankle dorslfexion when rocking posteriorly.     Other Standing Ankle Exercises  Single leg stance exercises performing dec'ing UE support with some sensastion of "pulling" medial malleolus      Ankle Exercises: Seated   BAPS  Sitting;Level 4    BAPS Limitations  10 reps ant/posterior, 10 reps lateral with cues to avoid knee/hip motion, 5 reps CW and CCW for  circles               PT Short Term Goals - 03/06/19 1232      PT SHORT TERM GOAL #1   Title  The patient will be indep with initial HEP.    Time  4    Period  Weeks    Status  On-going    Target Date  03/29/19      PT SHORT TERM GOAL #2   Title  The patient will improve AROM left ankle to be -10 degrees from neutral DF.    Time  4    Period  Weeks    Status  Achieved    Target Date  03/29/19      PT SHORT TERM GOAL #3   Title  The patient will ambulate in her home without cam walker independently per report.    Time  4    Period  Weeks    Status  Achieved    Target Date  03/29/19      PT SHORT TERM GOAL #4   Title  The patient will be further assessed on stairs and gait speed and LTGs to follow.    Time  4    Period  Weeks    Status  On-going    Target Date  03/29/19      PT SHORT TERM GOAL #5   Title  The patient will return demo scar tissue mobilization.    Time  4    Period  Weeks    Status  Achieved    Target Date  03/29/19        PT Long Term Goals - 02/27/19 2045      PT LONG TERM GOAL #1   Title  The patient will be indep with progression of HEP.    Time  8    Period  Weeks    Target Date  04/24/19      PT LONG TERM GOAL #2   Title  The patient will reduce limitation per FOTO from 46% down to 25%.    Time  8    Period  Weeks    Target Date  04/24/19      PT LONG TERM GOAL #3   Title  The patient will improve L ankle AROM to neutral position of DF.    Time  8    Period  Weeks    Target Date  04/24/19      PT LONG TERM GOAL #4   Title  The patient will negotiate steps with reciprocal pattern independently.    Time  8    Period  Weeks    Target Date  04/24/19      PT LONG TERM GOAL #5   Title  The patient will be able to tolerate prolonged standing > 2 hours to demo ability to be on her feet x hours at work.    Time  8    Period  Weeks    Target Date  04/24/19            Plan - 03/13/19 1017    Clinical Impression  Statement  The patient notes greater motion after STM.  The patient noting improvement with ambulation and less use of CAM walker.  She is safe with SPC and may borrow one from family member.  Plan to continue progressing to STGs/LTGS.    Examination-Activity Limitations  Stairs;Stand;Locomotion Level    Examination-Participation Restrictions  Community Activity    Stability/Clinical Decision Making  Stable/Uncomplicated    Rehab Potential  Good    PT Frequency  2x / week    PT Duration  8 weeks    PT Treatment/Interventions  ADLs/Self Care Home Management;Iontophoresis 4mg /ml Dexamethasone;Ultrasound;Cryotherapy;Electrical Stimulation;Moist Heat;Balance training;Therapeutic activities;Therapeutic exercise;Functional mobility training;Gait training;Stair training;Patient/family education;Taping;Manual techniques;Passive range of motion;Dry needling;Vasopneumatic Device    PT Next Visit Plan  BAPS, IASTM, gait speed, STM, strengthening to tolerance, L LE loading/weight bearing to tolerance    PT Home Exercise Plan  Access Code: [email or web address]    Consulted and Agree with Plan of Care  Patient       Patient will benefit from skilled therapeutic intervention in order to improve the following deficits and impairments:  Abnormal gait, Decreased range of motion, Difficulty walking, Decreased activity tolerance, Hypomobility, Impaired flexibility, Pain, Decreased scar mobility, Decreased strength, Impaired sensation  Visit Diagnosis: Pain in left ankle and joints of left foot  Muscle weakness (generalized)  Other abnormalities of gait and mobility  Localized edema     Problem List Patient Active Problem List   Diagnosis Date Noted  . Hypertension 10/02/2016  . Enlarged lymph node 07/02/2014  . Osteopenia 10/20/2012  . Routine general medical examination at a health care facility 09/29/2012    Tyrone Pautsch, PT 03/13/2019, 11:48 AM  [facility] [address] [place], [place], [place] Phone: [phone]   Fax:  [phone]  Name: Keri Veale MRN: Mickel Crow Date of Birth: [birth year]

## 2019-03-17 ENCOUNTER — Ambulatory Visit (INDEPENDENT_AMBULATORY_CARE_PROVIDER_SITE_OTHER): Payer: 59 | Admitting: Physical Therapy

## 2019-03-17 ENCOUNTER — Other Ambulatory Visit: Payer: Self-pay

## 2019-03-17 DIAGNOSIS — R600 Localized edema: Secondary | ICD-10-CM

## 2019-03-17 DIAGNOSIS — M25572 Pain in left ankle and joints of left foot: Secondary | ICD-10-CM | POA: Diagnosis not present

## 2019-03-17 DIAGNOSIS — M6281 Muscle weakness (generalized): Secondary | ICD-10-CM

## 2019-03-17 DIAGNOSIS — R2689 Other abnormalities of gait and mobility: Secondary | ICD-10-CM

## 2019-03-17 NOTE — Therapy (Signed)
[facility] [place], [place], [address] Phone: [phone]   Fax:  [phone]  Physical Therapy Treatment  Patient Details  Name: Dominique Rice MRN: [id number] Date of Birth: 1960-06-19 Referring Provider (PT): Wylene Simmer, MD   Encounter Date: 03/17/2019  PT End of Session - 03/17/19 1018    Visit Number  6    Number of Visits  16    Date for PT Re-Evaluation  04/24/19    Authorization Type  [employer] choice    PT Start Time  1013    PT Stop Time  1105    PT Time Calculation (min)  52 min    Activity Tolerance  Patient tolerated treatment well    Behavior During Therapy  [facility] for tasks assessed/performed       Past Medical History:  Diagnosis Date  . Hypertension   . Osteopenia   . Pneumonia     Past Surgical History:  Procedure Laterality Date  . ABDOMINAL HYSTERECTOMY    . CESAREAN SECTION     twice  . COLONOSCOPY  2014  . ENDOMETRIAL ABLATION  2005  . OOPHORECTOMY Left 1998  . ORIF ANKLE FRACTURE Left 12/16/2018   Procedure: OPEN REDUCTION INTERNAL FIXATION (ORIF) Left ankle trimalleolar fracture;  Surgeon: Wylene Simmer, MD;  Location: [place];  Service: Orthopedics;  Laterality: Left;  27min  . TUBAL LIGATION  1998    There were no vitals filed for this visit.  Subjective Assessment - 03/17/19 1019    Subjective  Pt reports she was very sore after last visit. Otherwise no new changes.    Patient Stated Goals  walk without boot, return to work (being on feet 12 hours)    Currently in Pain?  Yes    Pain Score  2     Pain Location  Ankle    Pain Orientation  Left;Medial;Posterior    Pain Descriptors / Indicators  Tightness;Sore    Aggravating Factors   rotating ankle and walking    Pain Relieving Factors  ice         [facility] PT Assessment - 03/17/19 0001      Assessment   Medical Diagnosis  L ankle trimalleolar fracture, internal fixation and L deltoid  ligament repair    Referring Provider (PT)  Wylene Simmer, MD    Onset Date/Surgical Date  12/06/18    Next MD Visit  03/23/19    Prior Therapy  none      PROM   Overall PROM Comments  60 deg PROM Lt great toe      [facility] Adult PT Treatment/Exercise - 03/17/19 0001      Vasopneumatic   Number Minutes Vasopneumatic   10 minutes    Vasopnuematic Location   Ankle    Vasopneumatic Pressure  Medium    Vasopneumatic Temperature   34 deg       Manual Therapy   Manual Therapy  Joint mobilization;Soft tissue mobilization;Myofascial release;Passive ROM    Joint Mobilization  Grade 2-3 AP mobs to Lt talucrural joint     Soft tissue mobilization  STM plantar fascia, soleus, flexor digitorum longus, gastroc, med/lat Achilles tendon.  Pin and stretch to same structures (DF of ankle) with and without knee flexion     Myofascial Release  Lt soleus    Passive ROM  Lt ankle into DF, Lt great toe into ext.       Tillar  I strip of reg Rock tape applied over Lt lateral ankle incision in zigzag pattern for scar managment.  I strip applied with 20% stretch at Lt medial ankle and following post tib/ flexor digitorum longus with perpendicular strips with 50% stretch applied to areas of increased pain.         Facilitate Muscle   I strip of Reg Rock tape applied to dorsal surface of Lt great toe to facilitate muscle.        Ankle Exercises: Seated   Ankle Circles/Pumps  AROM;Left;10 reps    Heel Raises  Both;10 reps    BAPS  Sitting;Level 4    BAPS Limitations  10 reps ant/posterior, 10 reps lateral with cues to avoid knee/hip motion, 5 reps CW and CCW for circles    Other Seated Ankle Exercises  AROM inversion/eversion x 15 reps, Lt heel slide with wt into thigh x 15 sec     Other Seated Ankle Exercises  sit to/from stand x 10 reps, cues for even weight (Lt foot slightly forward)      Ankle Exercises: Aerobic   Nustep  L4 x 5.5 minutes LEs only, for ankle ROM      Ankle Exercises:  Standing   Other Standing Ankle Exercises  Standing staggered stance with Lt foot back, flat foot to toe off (passive toe ext) x 10 reps (limited tolerance)    Other Standing Ankle Exercises  attempted to step down with RLE from 3" step, x 3 trials - unable to tolerate and demonstrates compensatory strategies.       Ankle Exercises: Stretches   Soleus Stretch  3 reps;20 seconds   standing, gentle   Slant Board Stretch  2 reps;30 seconds   L2 (L3 not tolerated)   Other Stretch  forward lunge with Lt foot on 12" step for ROM in Lt ankle x 10 reps, holding 5-8 sec                PT Short Term Goals - 03/06/19 1232      PT SHORT TERM GOAL #1   Title  The patient will be indep with initial HEP.    Time  4    Period  Weeks    Status  On-going    Target Date  03/29/19      PT SHORT TERM GOAL #2   Title  The patient will improve AROM left ankle to be -10 degrees from neutral DF.    Time  4    Period  Weeks    Status  Achieved    Target Date  03/29/19      PT SHORT TERM GOAL #3   Title  The patient will ambulate in her home without cam walker independently per report.    Time  4    Period  Weeks    Status  Achieved    Target Date  03/29/19      PT SHORT TERM GOAL #4   Title  The patient will be further assessed on stairs and gait speed and LTGs to follow.    Time  4    Period  Weeks    Status  On-going    Target Date  03/29/19      PT SHORT TERM GOAL #5   Title  The patient will return demo scar tissue mobilization.    Time  4    Period  Weeks    Status  Achieved    Target Date  03/29/19  PT Long Term Goals - 02/27/19 2045      PT LONG TERM GOAL #1   Title  The patient will be indep with progression of HEP.    Time  8    Period  Weeks    Target Date  04/24/19      PT LONG TERM GOAL #2   Title  The patient will reduce limitation per FOTO from 46% down to 25%.    Time  8    Period  Weeks    Target Date  04/24/19      PT LONG TERM GOAL #3   Title   The patient will improve L ankle AROM to neutral position of DF.    Time  8    Period  Weeks    Target Date  04/24/19      PT LONG TERM GOAL #4   Title  The patient will negotiate steps with reciprocal pattern independently.    Time  8    Period  Weeks    Target Date  04/24/19      PT LONG TERM GOAL #5   Title  The patient will be able to tolerate prolonged standing > 2 hours to demo ability to be on her feet x hours at work.    Time  8    Period  Weeks    Target Date  04/24/19            Plan - 03/17/19 1228    Clinical Impression Statement  Pt continues with limited Lt ankle ROM; improved tissue extensibility and reduction of discomfort after ROM exercises and manual therapy.  Pt is unable to tolerate stepping down from 3" step with RLE, but has no difficulty ascending 6" step with LLE.   Progressing towards goals.    Examination-Activity Limitations  Stairs;Stand;Locomotion Level    Examination-Participation Restrictions  Community Activity    Stability/Clinical Decision Making  Stable/Uncomplicated    Rehab Potential  Good    PT Frequency  2x / week    PT Duration  8 weeks    PT Treatment/Interventions  ADLs/Self Care Home Management;Iontophoresis 4mg /ml Dexamethasone;Ultrasound;Cryotherapy;Electrical Stimulation;Moist Heat;Balance training;Therapeutic activities;Therapeutic exercise;Functional mobility training;Gait training;Stair training;Patient/family education;Taping;Manual techniques;Passive range of motion;Dry needling;Vasopneumatic Device    PT Next Visit Plan  BAPS, IASTM, gait speed, STM, strengthening to tolerance, L LE loading/weight bearing to tolerance    PT Home Exercise Plan  Access Code: [email or web address]    Consulted and Agree with Plan of Care  Patient       Patient will benefit from skilled therapeutic intervention in order to improve the following deficits and impairments:  Abnormal gait, Decreased range of motion, Difficulty walking, Decreased activity  tolerance, Hypomobility, Impaired flexibility, Pain, Decreased scar mobility, Decreased strength, Impaired sensation  Visit Diagnosis: Pain in left ankle and joints of left foot  Muscle weakness (generalized)  Other abnormalities of gait and mobility  Localized edema     Problem List Patient Active Problem List   Diagnosis Date Noted  . Hypertension 10/02/2016  . Enlarged lymph node 07/02/2014  . Osteopenia 10/20/2012  . Routine general medical examination at a health care facility 09/29/2012   10/01/2012, PTA 03/17/19 12:31 PM  [facility] [address] [place], [place], [place] Phone: [phone]   Fax:  [phone]  Name: Meha Vidrine MRN: Mickel Crow Date of Birth: 03/26/1960

## 2019-03-20 ENCOUNTER — Ambulatory Visit (INDEPENDENT_AMBULATORY_CARE_PROVIDER_SITE_OTHER): Payer: 59 | Admitting: Physical Therapy

## 2019-03-20 ENCOUNTER — Other Ambulatory Visit: Payer: Self-pay

## 2019-03-20 DIAGNOSIS — M6281 Muscle weakness (generalized): Secondary | ICD-10-CM

## 2019-03-20 DIAGNOSIS — M25572 Pain in left ankle and joints of left foot: Secondary | ICD-10-CM | POA: Diagnosis not present

## 2019-03-20 DIAGNOSIS — R2689 Other abnormalities of gait and mobility: Secondary | ICD-10-CM

## 2019-03-20 DIAGNOSIS — R600 Localized edema: Secondary | ICD-10-CM

## 2019-03-20 NOTE — Therapy (Signed)
[facility] [place], [place], [address] Phone: [phone]   Fax:  [phone]  Physical Therapy Treatment  Patient Details  Name: Dominique Rice MRN: [id number] Date of Birth: 03-17-1960 Referring Provider (PT): Wylene Simmer, MD   Encounter Date: 03/20/2019  PT End of Session - 03/20/19 1058    Visit Number  7    Number of Visits  16    Date for PT Re-Evaluation  04/24/19    Authorization Type  [place] choice    PT Start Time  1019    PT Stop Time  1108    PT Time Calculation (min)  49 min    Activity Tolerance  Patient tolerated treatment well    Behavior During Therapy  [facility] for tasks assessed/performed       Past Medical History:  Diagnosis Date  . Hypertension   . Osteopenia   . Pneumonia     Past Surgical History:  Procedure Laterality Date  . ABDOMINAL HYSTERECTOMY    . CESAREAN SECTION     twice  . COLONOSCOPY  2014  . ENDOMETRIAL ABLATION  2005  . OOPHORECTOMY Left 1998  . ORIF ANKLE FRACTURE Left 12/16/2018   Procedure: OPEN REDUCTION INTERNAL FIXATION (ORIF) Left ankle trimalleolar fracture;  Surgeon: Wylene Simmer, MD;  Location: [facility];  Service: Orthopedics;  Laterality: Left;  38mn  . TUBAL LIGATION  1998    There were no vitals filed for this visit.  Subjective Assessment - 03/20/19 1025    Subjective  Pt reports the kinesiotape as well as massage helped reduce pain.  Pt returns to MD on Monday.    Patient Stated Goals  walk without boot, return to work (being on feet 12 hours)    Currently in Pain?  Yes    Pain Score  3     Pain Location  Ankle    Pain Orientation  Left;Posterior;Medial    Pain Descriptors / Indicators  Sore;Tightness    Aggravating Factors   rotating ankle and walking    Pain Relieving Factors  ice, ktape         [facility] PT Assessment - 03/20/19 0001      Assessment   Medical Diagnosis  L ankle trimalleolar fracture,  internal fixation and L deltoid ligament repair    Referring Provider (PT)  JWylene Simmer MD    Onset Date/Surgical Date  12/06/18    Next MD Visit  03/23/19    Prior Therapy  none      AROM   Right/Left Ankle  Left    Left Ankle Dorsiflexion  -5    Left Ankle Plantar Flexion  43    Left Ankle Inversion  22    Left Ankle Eversion  8      PROM   Overall PROM Comments  65 deg PROM Lt great toe    Left Ankle Dorsiflexion  5    Left Ankle Plantar Flexion  48    Left Ankle Inversion  30    Left Ankle Eversion  15      Ambulation/Gait   Ambulation/Gait  Yes    Ambulation/Gait Assistance  6: Modified independent (Device/Increase time)    Ambulation Distance (Feet)  400 Feet    Assistive device  Straight cane    Gait Pattern  Step-through pattern;Decreased arm swing - left;Decreased step length - right;Decreased stance time - left;Decreased dorsiflexion - left   decreased Lt toe off.  Gait Comments  cues to increase Lt arm swing (keeps it clenched at side), increase Rt step length, increase Lt heel strike and toe off.   some carry over and improved quality with increased distance.       [facility] Adult PT Treatment/Exercise - 03/20/19 0001      Vasopneumatic   Number Minutes Vasopneumatic   10 minutes    Vasopnuematic Location   Ankle    Vasopneumatic Pressure  Medium    Vasopneumatic Temperature   34 deg       Manual Therapy   Joint Mobilization  Grade 2-3 AP mobs to Lt talucrural joint, Lt calcaneal rock and mobilization between each ray.     Passive ROM  Lt ankle into DF, Lt great toe into ext. Lt ankle into PF, inversion, eversion      Kinesiotix   Create Space  I strip of reg Rock tape applied over Lt lateral ankle incision in zigzag pattern for scar managment.  I strip applied with 20% stretch at Lt medial ankle and following post tib/ flexor digitorum longus with perpendicular strips with 50% stretch applied to areas of increased pain.         Facilitate Muscle   I strip of Reg  Rock tape applied to dorsal surface of Lt great toe to facilitate muscle.        Ankle Exercises: Aerobic   Nustep  L4 x 7 minutes LEs only, for ankle ROM   PTA present to monitor and discuss progress     Ankle Exercises: Stretches   Plantar Fascia Stretch  2 reps;10 seconds    Soleus Stretch  3 reps;20 seconds   standing, gentle, L2 on slant board   Gastroc Stretch  2 reps;30 seconds   incline board, L3   Slant Board Stretch  --    Other Stretch  Lt hamstring stretch x 30 sec, Lt quad stretch x 30 sec       Ankle Exercises: Standing   Heel Raises  Both;10 reps   limited range, to tolerance   Toe Raise  10 reps   limited range    Other Standing Ankle Exercises  forward step down with RLE off of 3" foam pad x 15 reps, with unlateral support on railing;  repeated with lateral side stepping strategy off of 3" pad with RLE x 12      Ankle Exercises: Seated   Ankle Circles/Pumps  Left;5 reps;AROM    Other Seated Ankle Exercises  PF/DF x 10.                PT Short Term Goals - 03/20/19 1227      PT SHORT TERM GOAL #1   Title  The patient will be indep with initial HEP.    Time  4    Period  Weeks    Status  Achieved    Target Date  03/29/19      PT SHORT TERM GOAL #2   Title  The patient will improve AROM left ankle to be -10 degrees from neutral DF.    Time  4    Period  Weeks    Status  Achieved    Target Date  03/29/19      PT SHORT TERM GOAL #3   Title  The patient will ambulate in her home without cam walker independently per report.    Time  4    Period  Weeks    Status  Achieved  Target Date  03/29/19      PT SHORT TERM GOAL #4   Title  The patient will be further assessed on stairs and gait speed and LTGs to follow.    Time  4    Period  Weeks    Status  On-going    Target Date  03/29/19      PT SHORT TERM GOAL #5   Title  The patient will return demo scar tissue mobilization.    Time  4    Period  Weeks    Status  Achieved    Target Date   03/29/19        PT Long Term Goals - 03/20/19 1227      PT LONG TERM GOAL #1   Title  The patient will be indep with progression of HEP.    Time  8    Period  Weeks    Status  On-going      PT LONG TERM GOAL #2   Title  The patient will reduce limitation per FOTO from 46% down to 25%.    Time  8    Period  Weeks    Status  On-going      PT LONG TERM GOAL #3   Title  The patient will improve L ankle AROM to neutral position of DF.    Time  8    Period  Weeks    Status  On-going      PT LONG TERM GOAL #4   Title  The patient will negotiate steps with reciprocal pattern independently.    Time  8    Period  Weeks    Status  On-going      PT LONG TERM GOAL #5   Title  The patient will be able to tolerate prolonged standing > 2 hours to demo ability to be on her feet x hours at work.    Time  8    Period  Weeks    Status  On-going            Plan - 03/20/19 1228    Clinical Impression Statement  Pt's made gradual gains with Lt ankle ROM. Pt continues with very limited Lt great toe extension strength.  She is unable to descend step greater than 2.75" at this time. Pt met most of her STG and is progressing well towards LTGs.    Examination-Activity Limitations  Stairs;Stand;Locomotion Level    Examination-Participation Restrictions  Community Activity    Stability/Clinical Decision Making  Stable/Uncomplicated    Rehab Potential  Good    PT Frequency  2x / week    PT Duration  8 weeks    PT Treatment/Interventions  ADLs/Self Care Home Management;Iontophoresis 47m/ml Dexamethasone;Ultrasound;Cryotherapy;Electrical Stimulation;Moist Heat;Balance training;Therapeutic activities;Therapeutic exercise;Functional mobility training;Gait training;Stair training;Patient/family education;Taping;Manual techniques;Passive range of motion;Dry needling;Vasopneumatic Device    PT Next Visit Plan  BAPS, IASTM, gait speed, STM, strengthening to tolerance, L LE functional loading/weight  bearing to tolerance    PT Home Exercise Plan  Access Code: [email or web address]    Consulted and Agree with Plan of Care  Patient       Patient will benefit from skilled therapeutic intervention in order to improve the following deficits and impairments:  Abnormal gait, Decreased range of motion, Difficulty walking, Decreased activity tolerance, Hypomobility, Impaired flexibility, Pain, Decreased scar mobility, Decreased strength, Impaired sensation  Visit Diagnosis: Pain in left ankle and joints of left foot  Muscle weakness (generalized)  Other abnormalities of gait  and mobility  Localized edema     Problem List Patient Active Problem List   Diagnosis Date Noted  . Hypertension 10/02/2016  . Enlarged lymph node 07/02/2014  . Osteopenia 10/20/2012  . Routine general medical examination at a health care facility 09/29/2012   Kerin Perna, PTA 03/20/19 12:36 PM  [facility] [place], [place], [address] Phone: [phone]   Fax:  [phone]  Name: Dominique Rice MRN: [id number] Date of Birth: 05/25/1960

## 2019-03-23 DIAGNOSIS — S82852D Displaced trimalleolar fracture of left lower leg, subsequent encounter for closed fracture with routine healing: Secondary | ICD-10-CM | POA: Diagnosis not present

## 2019-03-24 ENCOUNTER — Encounter: Payer: Self-pay | Admitting: Physical Therapy

## 2019-03-24 ENCOUNTER — Ambulatory Visit (INDEPENDENT_AMBULATORY_CARE_PROVIDER_SITE_OTHER): Payer: 59 | Admitting: Physical Therapy

## 2019-03-24 ENCOUNTER — Other Ambulatory Visit: Payer: Self-pay

## 2019-03-24 DIAGNOSIS — M6281 Muscle weakness (generalized): Secondary | ICD-10-CM

## 2019-03-24 DIAGNOSIS — R2689 Other abnormalities of gait and mobility: Secondary | ICD-10-CM

## 2019-03-24 DIAGNOSIS — R600 Localized edema: Secondary | ICD-10-CM

## 2019-03-24 DIAGNOSIS — M25572 Pain in left ankle and joints of left foot: Secondary | ICD-10-CM | POA: Diagnosis not present

## 2019-03-24 NOTE — Therapy (Signed)
[facility] [employer] [address] [place], [place], [address] Phone: [phone]   Fax:  [phone]  Physical Therapy Treatment  Patient Details  Name: Dominique Rice MRN: [id number] Date of Birth: 1960/12/27 Referring Provider (PT): Toni Arthurs, MD   Encounter Date: 03/24/2019  PT End of Session - 03/24/19 1025    Visit Number  8    Number of Visits  16    Date for PT Re-Evaluation  04/24/19    Authorization Type  [place] choice    PT Start Time  1019    PT Stop Time  1106    PT Time Calculation (min)  47 min    Activity Tolerance  Patient tolerated treatment well    Behavior During Therapy  [facility] for tasks assessed/performed       Past Medical History:  Diagnosis Date  . Hypertension   . Osteopenia   . Pneumonia     Past Surgical History:  Procedure Laterality Date  . ABDOMINAL HYSTERECTOMY    . CESAREAN SECTION     twice  . COLONOSCOPY  2014  . ENDOMETRIAL ABLATION  2005  . OOPHORECTOMY Left 1998  . ORIF ANKLE FRACTURE Left 12/16/2018   Procedure: OPEN REDUCTION INTERNAL FIXATION (ORIF) Left ankle trimalleolar fracture;  Surgeon: Toni Arthurs, MD;  Location: [facility];  Service: Orthopedics;  Laterality: Left;   . TUBAL LIGATION  1998    There were no vitals filed for this visit.  Subjective Assessment - 03/24/19 1025    Subjective  Pt reports she saw MD and that he wants her to continue for a month. She was sore after last session, but it resolved with ice and massage. She is out of work for 1 more month. No Pain, Just tightness.    Patient Stated Goals  walk without boot, return to work (being on feet 12 hours)   Pain: 0/10      [facility] PT Assessment - 03/24/19 0001      Assessment   Medical Diagnosis  L ankle trimalleolar fracture, internal fixation and L deltoid ligament repair    Referring Provider (PT)  Toni Arthurs, MD    Onset Date/Surgical Date  12/06/18    Next MD  Visit  04/22/19    Prior Therapy  none      PROM   Left Ankle Dorsiflexion  9   foot on incline board.       [facility] Adult PT Treatment/Exercise - 03/24/19 0001      Exercises   Other Exercises   quadriped position with toes tucked for ext stretch in toes x 20 sec, then toes in neutral sitting back onto heels (with pillow behind buttocks) x 2 reps each;  high kneeling forward lunge for Lt DF stretch x 30 sec x 2 reps      Vasopneumatic   Number Minutes Vasopneumatic   10 minutes    Vasopnuematic Location   Ankle    Vasopneumatic Pressure  Medium    Vasopneumatic Temperature   34 deg       Kinesiotix   Create Space  I strip of reg Rock tape applied over Lt lateral ankle incision in zigzag pattern for scar managment.  I strip applied with 20% stretch at Lt medial ankle and following post tib/ flexor digitorum longus with perpendicular strips with 50% stretch applied to areas of increased pain.           Ankle Exercises: Aerobic  Nustep  L4 x 5 minutes LEs only, for ankle ROM   PTA present to monitor and discuss progress   Other Aerobic  lap around gym in between exercises to decrease stiffness.       Ankle Exercises: Stretches   Soleus Stretch  2 reps;20 seconds    Gastroc Stretch  2 reps;30 seconds      Ankle Exercises: Standing   SLS  Lt SLS on mini tramp without UE support x 30 sec    Heel Raises  Both;10 reps   limited range, to tolerance   Toe Raise  10 reps   limited range    Other Standing Ankle Exercises  forward step down with RLE off of 3" foam pad x 10 reps, with unlateral support on railing;  repeated with lateral side stepping strategy off of 3" pad with RLE x 10, step down on 4" step with BUE support on rails x 10 (with retro step up)      Ankle Exercises: Seated   Ankle Circles/Pumps  AROM;Left;5 reps    BAPS  Sitting;Level 4;10 reps    BAPS Limitations  inv/eversion, CW/CCW    Other Seated Ankle Exercises  bilat ankles inversion/ eversion AROM x 10 reps                 PT Short Term Goals - 03/20/19 1227      PT SHORT TERM GOAL #1   Title  The patient will be indep with initial HEP.    Time  4    Period  Weeks    Status  Achieved    Target Date  03/29/19      PT SHORT TERM GOAL #2   Title  The patient will improve AROM left ankle to be -10 degrees from neutral DF.    Time  4    Period  Weeks    Status  Achieved    Target Date  03/29/19      PT SHORT TERM GOAL #3   Title  The patient will ambulate in her home without cam walker independently per report.    Time  4    Period  Weeks    Status  Achieved    Target Date  03/29/19      PT SHORT TERM GOAL #4   Title  The patient will be further assessed on stairs and gait speed and LTGs to follow.    Time  4    Period  Weeks    Status  On-going    Target Date  03/29/19      PT SHORT TERM GOAL #5   Title  The patient will return demo scar tissue mobilization.    Time  4    Period  Weeks    Status  Achieved    Target Date  03/29/19        PT Long Term Goals - 03/20/19 1227      PT LONG TERM GOAL #1   Title  The patient will be indep with progression of HEP.    Time  8    Period  Weeks    Status  On-going      PT LONG TERM GOAL #2   Title  The patient will reduce limitation per FOTO from 46% down to 25%.    Time  8    Period  Weeks    Status  On-going      PT LONG TERM GOAL #3   Title  The patient will improve L ankle AROM to neutral position of DF.    Time  8    Period  Weeks    Status  On-going      PT LONG TERM GOAL #4   Title  The patient will negotiate steps with reciprocal pattern independently.    Time  8    Period  Weeks    Status  On-going      PT LONG TERM GOAL #5   Title  The patient will be able to tolerate prolonged standing > 2 hours to demo ability to be on her feet x hours at work.    Time  8    Period  Weeks    Status  On-going            Plan - 03/24/19 1046    Clinical Impression Statement  Pt was able to tolerate 4" step  down today with greater ease; yet still painful.  PROM is improving.  She tolerated all exercises with pain no greater than 4/10; eased with rest.  Progressing well towards goals.    Examination-Activity Limitations  Stairs;Stand;Locomotion Level    Examination-Participation Restrictions  Community Activity    Stability/Clinical Decision Making  Stable/Uncomplicated    Rehab Potential  Good    PT Frequency  2x / week    PT Duration  8 weeks    PT Treatment/Interventions  ADLs/Self Care Home Management;Iontophoresis 4mg /ml Dexamethasone;Ultrasound;Cryotherapy;Electrical Stimulation;Moist Heat;Balance training;Therapeutic activities;Therapeutic exercise;Functional mobility training;Gait training;Stair training;Patient/family education;Taping;Manual techniques;Passive range of motion;Dry needling;Vasopneumatic Device    PT Next Visit Plan  test gait speed; trial of tape application of stirup. continue progressive ROM and WB strengthening for Lt ankle.    PT Home Exercise Plan  Access Code: [email or web address]    Consulted and Agree with Plan of Care  Patient       Patient will benefit from skilled therapeutic intervention in order to improve the following deficits and impairments:  Abnormal gait, Decreased range of motion, Difficulty walking, Decreased activity tolerance, Hypomobility, Impaired flexibility, Pain, Decreased scar mobility, Decreased strength, Impaired sensation  Visit Diagnosis: Pain in left ankle and joints of left foot  Muscle weakness (generalized)  Other abnormalities of gait and mobility  Localized edema     Problem List Patient Active Problem List   Diagnosis Date Noted  . Hypertension 10/02/2016  . Enlarged lymph node 07/02/2014  . Osteopenia 10/20/2012  . Routine general medical examination at a health care facility 09/29/2012   Kerin Perna, PTA 03/24/19 1:21 PM  [facility] [place], [place], [address] Phone: [phone]   Fax:  [phone]  Name: Dominique Rice MRN: [id number] Date of Birth: 12/19/1960

## 2019-03-27 ENCOUNTER — Ambulatory Visit (INDEPENDENT_AMBULATORY_CARE_PROVIDER_SITE_OTHER): Payer: 59 | Admitting: Rehabilitative and Restorative Service Providers"

## 2019-03-27 ENCOUNTER — Other Ambulatory Visit: Payer: Self-pay

## 2019-03-27 DIAGNOSIS — M25572 Pain in left ankle and joints of left foot: Secondary | ICD-10-CM

## 2019-03-27 DIAGNOSIS — R2689 Other abnormalities of gait and mobility: Secondary | ICD-10-CM | POA: Diagnosis not present

## 2019-03-27 DIAGNOSIS — R600 Localized edema: Secondary | ICD-10-CM

## 2019-03-27 DIAGNOSIS — M6281 Muscle weakness (generalized): Secondary | ICD-10-CM | POA: Diagnosis not present

## 2019-03-27 NOTE — Therapy (Signed)
[facility] [place] [address] [place], [place], [address] Phone: [phone]   Fax:  [phone]  Physical Therapy Treatment  Patient Details  Name: Dominique Rice MRN: [id number] Date of Birth: 19-May-1960 Referring Provider (PT): Toni Arthurs, MD   Encounter Date: 03/27/2019  PT End of Session - 03/27/19 1502    Visit Number  9    Number of Visits  16    Date for PT Re-Evaluation  04/24/19    Authorization Type  [place] choice    PT Start Time  1021    PT Stop Time  1110    PT Time Calculation (min)  49 min    Activity Tolerance  Patient tolerated treatment well    Behavior During Therapy  [facility] for tasks assessed/performed       Past Medical History:  Diagnosis Date  . Hypertension   . Osteopenia   . Pneumonia     Past Surgical History:  Procedure Laterality Date  . ABDOMINAL HYSTERECTOMY    . CESAREAN SECTION     twice  . COLONOSCOPY  2014  . ENDOMETRIAL ABLATION  2005  . OOPHORECTOMY Left 1998  . ORIF ANKLE FRACTURE Left 12/16/2018   Procedure: OPEN REDUCTION INTERNAL FIXATION (ORIF) Left ankle trimalleolar fracture;  Surgeon: Toni Arthurs, MD;  Location: [facility];  Service: Orthopedics;  Laterality: Left;   . TUBAL LIGATION  1998    There were no vitals filed for this visit.  Subjective Assessment - 03/27/19 1500    Subjective  The patient had some soreness in L lateral lower leg after last session.  She continues to feel some improvement with mobility.    Patient Stated Goals  walk without boot, return to work (being on feet 12 hours)    Currently in Pain?  Yes    Pain Score  3     Pain Location  Ankle    Pain Orientation  Left;Posterior;Lateral   noted lateral discomfort (typically medial)   Pain Descriptors / Indicators  Tightness;Sore    Pain Type  Acute pain    Pain Onset  More than a month ago    Pain Frequency  Constant    Aggravating Factors   loading L ankle     Pain Relieving Factors  ice massage, tape         [facility] PT Assessment - 03/27/19 1026      Assessment   Medical Diagnosis  L ankle trimalleolar fracture, internal fixation and L deltoid ligament repair    Referring Provider (PT)  Toni Arthurs, MD    Onset Date/Surgical Date  12/06/18    Prior Therapy  none                   [facility] Adult PT Treatment/Exercise - 03/27/19 1034      Exercises   Exercises  Ankle      Vasopneumatic   Number Minutes Vasopneumatic   10 minutes    Vasopnuematic Location   Ankle    Vasopneumatic Pressure  Medium    Vasopneumatic Temperature   34 deg      Manual Therapy   Manual Therapy  Soft tissue mobilization;Taping    Soft tissue mobilization  plantar STM for deep stretch and overpressure into PROM adding toe extension,  IASTM to Left soleous musculature  * tightness noted flexor digitorum brevis    Passive ROM  L ankle into DF with great toe extension  Ankle Exercises: Stretches   Slant Board Stretch  2 reps;30 seconds      Ankle Exercises: Aerobic   Nustep  L4 x 5 minutes    Other Aerobic  Gait around gym x 150 feet x 2 reps encouraging bilateral heel strike and longer stride length      Ankle Exercises: Standing   SLS  left single leg stance moving R LE into forward flexion for dynamic control    Heel Raises  10 reps    Heel Raises Limitations  *added to HEP for heel/toe raise recommending not go through full ROM to begin slowly    Toe Raise  10 reps    Warrior I  leaning to load L LE and touch wall from mini lunge position,     Other Standing Ankle Exercises  backwards walking x 20 feet x 3 reps    Other Standing Ankle Exercises  marching x 20 feet x 3 reps             PT Education - 03/27/19 1501    Education Details  HEP progression    Person(s) Educated  Patient    Methods  Explanation;Demonstration;Handout    Comprehension  Returned demonstration;Verbalized understanding       PT Short Term Goals -  03/20/19 1227      PT SHORT TERM GOAL #1   Title  The patient will be indep with initial HEP.    Time  4    Period  Weeks    Status  Achieved    Target Date  03/29/19      PT SHORT TERM GOAL #2   Title  The patient will improve AROM left ankle to be -10 degrees from neutral DF.    Time  4    Period  Weeks    Status  Achieved    Target Date  03/29/19      PT SHORT TERM GOAL #3   Title  The patient will ambulate in her home without cam walker independently per report.    Time  4    Period  Weeks    Status  Achieved    Target Date  03/29/19      PT SHORT TERM GOAL #4   Title  The patient will be further assessed on stairs and gait speed and LTGs to follow.    Time  4    Period  Weeks    Status  On-going    Target Date  03/29/19      PT SHORT TERM GOAL #5   Title  The patient will return demo scar tissue mobilization.    Time  4    Period  Weeks    Status  Achieved    Target Date  03/29/19        PT Long Term Goals - 03/20/19 1227      PT LONG TERM GOAL #1   Title  The patient will be indep with progression of HEP.    Time  8    Period  Weeks    Status  On-going      PT LONG TERM GOAL #2   Title  The patient will reduce limitation per FOTO from 46% down to 25%.    Time  8    Period  Weeks    Status  On-going      PT LONG TERM GOAL #3   Title  The patient will improve L ankle AROM to neutral position  of DF.    Time  8    Period  Weeks    Status  On-going      PT LONG TERM GOAL #4   Title  The patient will negotiate steps with reciprocal pattern independently.    Time  8    Period  Weeks    Status  On-going      PT LONG TERM GOAL #5   Title  The patient will be able to tolerate prolonged standing > 2 hours to demo ability to be on her feet x hours at work.    Time  8    Period  Weeks    Status  On-going            Plan - 03/27/19 1502    Clinical Impression Statement  The patient continues to improve loading through the L LE during gait  activities.  PT progressed HEP to initiate standing strengthening and will monitor response.  Plan to continue working to LTGs.    Examination-Activity Limitations  Stairs;Stand;Locomotion Level    Examination-Participation Restrictions  Community Activity    Stability/Clinical Decision Making  Stable/Uncomplicated    Rehab Potential  Good    PT Frequency  2x / week    PT Duration  8 weeks    PT Treatment/Interventions  ADLs/Self Care Home Management;Iontophoresis 4mg /ml Dexamethasone;Ultrasound;Cryotherapy;Electrical Stimulation;Moist Heat;Balance training;Therapeutic activities;Therapeutic exercise;Functional mobility training;Gait training;Stair training;Patient/family education;Taping;Manual techniques;Passive range of motion;Dry needling;Vasopneumatic Device    PT Next Visit Plan  test gait speed; trial of tape application of stirup. continue progressive ROM and WB strengthening for Lt ankle.    PT Home Exercise Plan  Access Code: [email or web address]    Consulted and Agree with Plan of Care  Patient       Patient will benefit from skilled therapeutic intervention in order to improve the following deficits and impairments:  Abnormal gait, Decreased range of motion, Difficulty walking, Decreased activity tolerance, Hypomobility, Impaired flexibility, Pain, Decreased scar mobility, Decreased strength, Impaired sensation  Visit Diagnosis: Pain in left ankle and joints of left foot  Muscle weakness (generalized)  Other abnormalities of gait and mobility  Localized edema     Problem List Patient Active Problem List   Diagnosis Date Noted  . Hypertension 10/02/2016  . Enlarged lymph node 07/02/2014  . Osteopenia 10/20/2012  . Routine general medical examination at a health care facility 09/29/2012    Arleatha Philipps , PT 03/27/2019, 3:12 PM  [facility] [address] [place], [place], [place] Phone: [phone]   Fax:   [phone]  Name: Dominique Rice MRN: [id number] Crow Date of Birth: [birth year]

## 2019-03-27 NOTE — Patient Instructions (Signed)
Access Code: [email or web address]  URL: [email or web address]  Date: 03/27/2019  Prepared by: Margretta Ditty   Exercises Long Sitting Calf Stretch with Strap - 3 reps - 1 sets - 30 seconds hold - 2x daily - 7x weekly Seated Self Great Toe Stretch - 3 reps - 1 sets - 30 seconds hold - 2x daily - 7x weekly sitting toe stretch - 3 reps - 1 sets - 30 hold - 2x daily - 7x weekly Hooklying Hamstring Stretch with Strap - 2-3 reps - 30 hold - 1x daily - 7x weekly Prone Quadriceps Stretch with Strap - 2-3 reps - 30 hold - 1x daily                            - 7x weekly Seated Table Piriformis Stretch - 2-3 reps - 30 hold - 1x daily - 7x weekly Sideways Walking - 10 reps - 1 sets - 2x daily - 7x weekly Heel Toe Raises with Counter Support - 10 reps - 1 sets - 2x daily - 7x weekly

## 2019-03-30 MED FILL — AMLODIPINE BESYLATE 5 MG TA: 5 | 90 days supply | Qty: 90 | Fill #1

## 2019-03-31 ENCOUNTER — Encounter: Payer: Self-pay | Admitting: Physical Therapy

## 2019-03-31 ENCOUNTER — Other Ambulatory Visit: Payer: Self-pay

## 2019-03-31 ENCOUNTER — Ambulatory Visit (INDEPENDENT_AMBULATORY_CARE_PROVIDER_SITE_OTHER): Payer: 59 | Admitting: Physical Therapy

## 2019-03-31 DIAGNOSIS — M25572 Pain in left ankle and joints of left foot: Secondary | ICD-10-CM | POA: Diagnosis not present

## 2019-03-31 DIAGNOSIS — R600 Localized edema: Secondary | ICD-10-CM

## 2019-03-31 DIAGNOSIS — R2689 Other abnormalities of gait and mobility: Secondary | ICD-10-CM

## 2019-03-31 DIAGNOSIS — M6281 Muscle weakness (generalized): Secondary | ICD-10-CM | POA: Diagnosis not present

## 2019-03-31 DIAGNOSIS — H524 Presbyopia: Secondary | ICD-10-CM | POA: Diagnosis not present

## 2019-03-31 DIAGNOSIS — H5213 Myopia, bilateral: Secondary | ICD-10-CM | POA: Diagnosis not present

## 2019-03-31 DIAGNOSIS — Z135 Encounter for screening for eye and ear disorders: Secondary | ICD-10-CM | POA: Diagnosis not present

## 2019-03-31 NOTE — Patient Instructions (Signed)
Access Code: [email or web address]: [email or web address]: 03/16/2021Prepared by: [facility] [place]  Long Sitting Calf Stretch with Strap - 2 x daily - 7 x weekly - 3 reps - 1 sets - 30 seconds hold  Seated Self Great Toe Stretch - 2 x daily - 7 x weekly - 3 reps - 1 sets - 30 seconds hold  sitting toe stretch - 2 x daily - 7 x weekly - 3 reps - 1 sets - 30 hold  Hooklying Hamstring Stretch with Strap - 1 x daily - 7 x weekly - 2-3 reps - 30 hold  Prone Quadriceps Stretch with Strap - 1 x daily - 7 x weekly - 2-3 reps - 30 hold  Seated Table Piriformis Stretch - 1 x daily - 7 x weekly - 2-3 reps - 30 hold  Sideways Walking - 2 x daily - 7 x weekly - 10 reps - 1 sets  Heel Toe Raises with Counter Support - 2 x daily - 7 x weekly - 10 reps - 1 sets  Long Sitting Ankle Dorsiflexion with Anchored Resistance - 2 x daily - 7 x weekly - 1 sets - 10 reps  Long Sitting Ankle Inversion with Anchored Resistance - 2 x daily - 7 x weekly - 1 sets - 10 reps  Seated Ankle Eversion with Anchored Resistance - 2 x daily - 7 x weekly - 1 sets - 10 reps

## 2019-03-31 NOTE — Therapy (Addendum)
[facility] [place] [address] [place], [place], [address] Phone: [phone]   Fax:  [phone]  Physical Therapy Treatment  Patient Details  Name: Dominique Rice MRN: [id number] Date of Birth: 1960-02-17 Referring Provider (PT): Toni Arthurs, MD   Encounter Date: 03/31/2019  PT End of Session - 03/31/19 0932    Visit Number  10    Number of Visits  16    Date for PT Re-Evaluation  04/24/19    Authorization Type  [place] choice    PT Start Time  0929    PT Stop Time  1020    PT Time Calculation (min)  51 min       Past Medical History:  Diagnosis Date  . Hypertension   . Osteopenia   . Pneumonia     Past Surgical History:  Procedure Laterality Date  . ABDOMINAL HYSTERECTOMY    . CESAREAN SECTION     twice  . COLONOSCOPY  2014  . ENDOMETRIAL ABLATION  2005  . OOPHORECTOMY Left 1998  . ORIF ANKLE FRACTURE Left 12/16/2018   Procedure: OPEN REDUCTION INTERNAL FIXATION (ORIF) Left ankle trimalleolar fracture;  Surgeon: Toni Arthurs, MD;  Location: [facility];  Service: Orthopedics;  Laterality: Left;   . TUBAL LIGATION  1998    There were no vitals filed for this visit.  Subjective Assessment - 03/31/19 0933    Subjective  Pt reports Lt hip pain is less.  Continued soreness in Lt lateral lower leg. She continues to use [facility] in house and community.    Currently in Pain?  No/denies   just tightness.   Pain Score  0-No pain         [facility] PT Assessment - 03/31/19 0001      Assessment   Medical Diagnosis  L ankle trimalleolar fracture, internal fixation and L deltoid ligament repair    Referring Provider (PT)  Toni Arthurs, MD    Onset Date/Surgical Date  12/06/18    Next MD Visit  04/22/19    Prior Therapy  none      [facility] Adult PT Treatment/Exercise - 03/31/19 0001      Vasopneumatic   Number Minutes Vasopneumatic   10 minutes    Vasopnuematic Location   Ankle    Vasopneumatic  Pressure  Medium    Vasopneumatic Temperature   34 deg      Manual Therapy   Manual therapy comments  I strip of reg Rock tape applied above medial malleoli, crossing over talus, running under arch and pulling up along post tib. small I strips of tape applied to dorsum of great Lt toe (pulled into ext) to assist with DF.  I strip of rock tape applied plantar fascia with 50% stretch to assist with support to post tib insertion.  small strip of reg tape applied to lateral incision to assist with scar management and desensitization.       Ankle Exercises: Aerobic   Tread Mill  retro gait on treadmill at -0.7 x 1.5 min     Nustep  L4 x 6.5 minutes LEs only, for ankle ROM   PTA present to monitor and discuss progress     Ankle Exercises: Stretches   Slant Board Stretch  2 reps;30 seconds   L3, knees straight   Other Stretch  Lt hamstring stretch x 30 sec, Lt quad stretch x 30 sec ;  Lt piriformis stretch x 20 sec x 2 reps; Lt  ITB stretch x 15 sec  x 2 reps    Other Stretch  forward lunge with Lt ankle on 12" step for stretch in ankle x 10 sec x 5 reps      Ankle Exercises: Standing   Heel Raises  10 reps   range to tolerance, with toe raises   Other Standing Ankle Exercises  forward step down with RLE off of 3" foam pad x 5 reps, with unlateral support on railing;   step down on 4" step and retro step up with LLE x 10; lateral step up LLE x 10; reciprocal pattern on 4" and 6" steps x 5 steps.       Ankle Exercises: Seated   Other Seated Ankle Exercises  Lt ankle inversion, eversion, and DF with yellow band x 10              PT Education - 03/31/19 1232    Education Details  HEP - issued yellow band    Person(s) Educated  Patient    Methods  Explanation;Handout;Demonstration;Tactile cues;Verbal cues    Comprehension  Verbalized understanding;Returned demonstration       PT Short Term Goals - 03/20/19 1227      PT SHORT TERM GOAL #1   Title  The patient will be indep with initial  HEP.    Time  4    Period  Weeks    Status  Achieved    Target Date  03/29/19      PT SHORT TERM GOAL #2   Title  The patient will improve AROM left ankle to be -10 degrees from neutral DF.    Time  4    Period  Weeks    Status  Achieved    Target Date  03/29/19      PT SHORT TERM GOAL #3   Title  The patient will ambulate in her home without cam walker independently per report.    Time  4    Period  Weeks    Status  Achieved    Target Date  03/29/19      PT SHORT TERM GOAL #4   Title  The patient will be further assessed on stairs and gait speed and LTGs to follow.    Time  4    Period  Weeks    Status  On-going    Target Date  03/29/19      PT SHORT TERM GOAL #5   Title  The patient will return demo scar tissue mobilization.    Time  4    Period  Weeks    Status  Achieved    Target Date  03/29/19        PT Long Term Goals - 03/20/19 1227      PT LONG TERM GOAL #1   Title  The patient will be indep with progression of HEP.    Time  8    Period  Weeks    Status  On-going      PT LONG TERM GOAL #2   Title  The patient will reduce limitation per FOTO from 46% down to 25%.    Time  8    Period  Weeks    Status  On-going      PT LONG TERM GOAL #3   Title  The patient will improve L ankle AROM to neutral position of DF.    Time  8    Period  Weeks    Status  On-going      PT LONG TERM GOAL #4   Title  The patient will negotiate steps with reciprocal pattern independently.    Time  8    Period  Weeks    Status  On-going      PT LONG TERM GOAL #5   Title  The patient will be able to tolerate prolonged standing > 2 hours to demo ability to be on her feet x hours at work.    Time  8    Period  Weeks    Status  On-going            Plan - 03/31/19 1141    Clinical Impression Statement  Pt able to tolerate forward descending 3", 4" and 6" step with minimal discomfort today (and BUE support on rails).  Added light resistance with Lt ankle inversion,  eversion, and DF - without difficulty or pain.  Progressing towards remaining STG/LTG.    Examination-Activity Limitations  Stairs;Stand;Locomotion Level    Examination-Participation Restrictions  Community Activity    Stability/Clinical Decision Making  Stable/Uncomplicated    Rehab Potential  Good    PT Frequency  2x / week    PT Duration  8 weeks    PT Treatment/Interventions  ADLs/Self Care Home Management;Iontophoresis 4mg /ml Dexamethasone;Ultrasound;Cryotherapy;Electrical Stimulation;Moist Heat;Balance training;Therapeutic activities;Therapeutic exercise;Functional mobility training;Gait training;Stair training;Patient/family education;Taping;Manual techniques;Passive range of motion;Dry needling;Vasopneumatic Device    PT Next Visit Plan  assess response to tape; continue progressive ROM and WB strengthening for Lt ankle.    PT Home Exercise Plan  Access Code: [email or web address]    Consulted and Agree with Plan of Care  Patient       Patient will benefit from skilled therapeutic intervention in order to improve the following deficits and impairments:  Abnormal gait, Decreased range of motion, Difficulty walking, Decreased activity tolerance, Hypomobility, Impaired flexibility, Pain, Decreased scar mobility, Decreased strength, Impaired sensation  Visit Diagnosis: Pain in left ankle and joints of left foot  Muscle weakness (generalized)  Other abnormalities of gait and mobility  Localized edema     Problem List Patient Active Problem List   Diagnosis Date Noted  . Hypertension 10/02/2016  . Enlarged lymph node 07/02/2014  . Osteopenia 10/20/2012  . Routine general medical examination at a health care facility 09/29/2012    Kerin Perna, PTA 03/31/19 12:33 PM  [place], [place], [address] Phone: [phone]   Fax:  [phone]  Name: Dominique Rice MRN: [id number] Date of Birth:  Mar 24, 1960

## 2019-04-03 ENCOUNTER — Ambulatory Visit (INDEPENDENT_AMBULATORY_CARE_PROVIDER_SITE_OTHER): Payer: 59 | Admitting: Physical Therapy

## 2019-04-03 ENCOUNTER — Other Ambulatory Visit: Payer: Self-pay

## 2019-04-03 ENCOUNTER — Encounter: Payer: Self-pay | Admitting: Physical Therapy

## 2019-04-03 DIAGNOSIS — M6281 Muscle weakness (generalized): Secondary | ICD-10-CM

## 2019-04-03 DIAGNOSIS — M25572 Pain in left ankle and joints of left foot: Secondary | ICD-10-CM | POA: Diagnosis not present

## 2019-04-03 DIAGNOSIS — R2689 Other abnormalities of gait and mobility: Secondary | ICD-10-CM | POA: Diagnosis not present

## 2019-04-03 DIAGNOSIS — R600 Localized edema: Secondary | ICD-10-CM

## 2019-04-03 NOTE — Therapy (Signed)
[facility] [place], [place], [address] Phone: [phone]   Fax:  [phone]  Physical Therapy Treatment  Patient Details  Name: Marilena Trevathan MRN: [id number] Date of Birth: May 19, 1960 Referring Provider (PT): Wylene Simmer, MD   Encounter Date: 04/03/2019  PT End of Session - 04/03/19 1023    Visit Number  11    Number of Visits  16    Date for PT Re-Evaluation  04/24/19    Authorization Type  [place] choice    PT Start Time  1017    PT Stop Time  1102    PT Time Calculation (min)  45 min    Activity Tolerance  Patient tolerated treatment well    Behavior During Therapy  [facility] for tasks assessed/performed       Past Medical History:  Diagnosis Date  . Hypertension   . Osteopenia   . Pneumonia     Past Surgical History:  Procedure Laterality Date  . ABDOMINAL HYSTERECTOMY    . CESAREAN SECTION     twice  . COLONOSCOPY  2014  . ENDOMETRIAL ABLATION  2005  . OOPHORECTOMY Left 1998  . ORIF ANKLE FRACTURE Left 12/16/2018   Procedure: OPEN REDUCTION INTERNAL FIXATION (ORIF) Left ankle trimalleolar fracture;  Surgeon: Wylene Simmer, MD;  Location: [facility];  Service: Orthopedics;  Laterality: Left;  19mn  . TUBAL LIGATION  1998    There were no vitals filed for this visit.  Subjective Assessment - 04/03/19 1021    Subjective  Pt reports she feels like she has had a lot of progress over last few days.  She reports some pain in her Lt great toe with passive flexion.  She reports she has been using a reciprocal pattern with stairs at home.  She states the ktape on Lt foot helped reduce her pain with weight bearing.    Patient Stated Goals  walk without boot, return to work (being on feet 12 hours)    Currently in Pain?  No/denies    Pain Score  0-No pain         [facility] PT Assessment - 04/03/19 0001      Assessment   Medical Diagnosis  L ankle trimalleolar fracture,  internal fixation and L deltoid ligament repair    Referring Provider (PT)  JWylene Simmer MD    Onset Date/Surgical Date  12/06/18    Next MD Visit  04/22/19    Prior Therapy  none      AROM   Left Ankle Dorsiflexion  0    Left Ankle Plantar Flexion  45    Left Ankle Inversion  23    Left Ankle Eversion  11       [facility] Adult PT Treatment/Exercise - 04/03/19 0001      Vasopneumatic   Number Minutes Vasopneumatic   10 minutes    Vasopnuematic Location   Ankle    Vasopneumatic Pressure  Medium    Vasopneumatic Temperature   34 deg      Manual Therapy   Manual therapy comments  I strip of reg rock tape applied to Lt plantar fascia with 50% stretch, I strip applied along post tib, I strip placed over lateral Lt ankle incision to desensitize.     Joint Mobilization  Grade 2-3 AP mobs to Lt talucrural joint, Lt calcaneal rock and mobilization between each ray.     Passive ROM  Lt great toe into ext/ flexion,  2nd toe flex/ext (prolonged stretches)       Ankle Exercises: Stretches   Plantar Fascia Stretch  1 rep;30 seconds   LLE on prostretch   Slant Board Stretch  2 reps;30 seconds   L3, knees straight   Other Stretch  Lt quad stretch x 45 sec x 2 reps    Other Stretch  forward lunge with Lt ankle on 12" step for stretch in ankle x 10 sec x 5 reps      Ankle Exercises: Standing   Other Standing Ankle Exercises  PF/DF with bilat feet on 1/2 foam roller x 10, then balancing x 30 sec;  tandem stance on 1/2 foam roller  (flat side down) x 20 sec x 1 rep each side with intermitttent UE to steady.     Other Standing Ankle Exercises  forward step down on 4" step with RLE x 10 (retro step up);  Lateral step ups with LLE on 4" step x 10 (easy); step down on 6" with LLE retro step up x 5 reps.       Ankle Exercises: Seated   Ankle Circles/Pumps  AROM;Left;10 reps      Ankle Exercises: Aerobic   Nustep  L4 x 5 minutes LEs only, for ankle ROM   PTA present to monitor and discuss progress   Other  Aerobic  Gait around gym x 150 feet (without SPC) encouraging increased DF with Lt heel strike               PT Short Term Goals - 03/20/19 1227      PT SHORT TERM GOAL #1   Title  The patient will be indep with initial HEP.    Time  4    Period  Weeks    Status  Achieved    Target Date  03/29/19      PT SHORT TERM GOAL #2   Title  The patient will improve AROM left ankle to be -10 degrees from neutral DF.    Time  4    Period  Weeks    Status  Achieved    Target Date  03/29/19      PT SHORT TERM GOAL #3   Title  The patient will ambulate in her home without cam walker independently per report.    Time  4    Period  Weeks    Status  Achieved    Target Date  03/29/19      PT SHORT TERM GOAL #4   Title  The patient will be further assessed on stairs and gait speed and LTGs to follow.    Time  4    Period  Weeks    Status  On-going    Target Date  03/29/19      PT SHORT TERM GOAL #5   Title  The patient will return demo scar tissue mobilization.    Time  4    Period  Weeks    Status  Achieved    Target Date  03/29/19        PT Long Term Goals - 04/03/19 1114      PT LONG TERM GOAL #1   Title  The patient will be indep with progression of HEP.    Time  8    Period  Weeks    Status  On-going      PT LONG TERM GOAL #2   Title  The patient will reduce limitation per FOTO from 46% down  to 25%.    Time  8    Period  Weeks    Status  On-going      PT LONG TERM GOAL #3   Title  The patient will improve L ankle AROM to neutral position of DF.    Time  8    Period  Weeks    Status  Achieved      PT LONG TERM GOAL #4   Title  The patient will negotiate steps with reciprocal pattern independently.    Time  8    Period  Weeks    Status  Partially Met      PT LONG TERM GOAL #5   Title  The patient will be able to tolerate prolonged standing > 2 hours to demo ability to be on her feet x hours at work.    Time  8    Period  Weeks    Status  On-going             Plan - 04/03/19 1115    Clinical Impression Statement  Pt's Lt great toe remains stiff and tender with mobilization. Lt ankle ROM gradually improving.  Pt reporting greater mobility with less dependence on SPC with gait.  Progressing well towards LTGs.    Examination-Activity Limitations  Stairs;Stand;Locomotion Level    Examination-Participation Restrictions  Community Activity    Stability/Clinical Decision Making  Stable/Uncomplicated    Rehab Potential  Good    PT Frequency  2x / week    PT Duration  8 weeks    PT Treatment/Interventions  ADLs/Self Care Home Management;Iontophoresis 56m/ml Dexamethasone;Ultrasound;Cryotherapy;Electrical Stimulation;Moist Heat;Balance training;Therapeutic activities;Therapeutic exercise;Functional mobility training;Gait training;Stair training;Patient/family education;Taping;Manual techniques;Passive range of motion;Dry needling;Vasopneumatic Device    PT Next Visit Plan  continue progressive ROM and WB strengthening for Lt ankle.    PT Home Exercise Plan  Access Code: [email or web address]    Consulted and Agree with Plan of Care  Patient       Patient will benefit from skilled therapeutic intervention in order to improve the following deficits and impairments:  Abnormal gait, Decreased range of motion, Difficulty walking, Decreased activity tolerance, Hypomobility, Impaired flexibility, Pain, Decreased scar mobility, Decreased strength, Impaired sensation  Visit Diagnosis: Pain in left ankle and joints of left foot  Muscle weakness (generalized)  Other abnormalities of gait and mobility  Localized edema     Problem List Patient Active Problem List   Diagnosis Date Noted  . Hypertension 10/02/2016  . Enlarged lymph node 07/02/2014  . Osteopenia 10/20/2012  . Routine general medical examination at a health care facility 09/29/2012    JKerin Perna PTA 04/03/19 11:22 AM  [email or web address] [address]Phone: [phone]  Fax:  [phone] Name: JKristinia LeavyMRN: [id number] of Birth: 9December 26, 1962

## 2019-04-07 ENCOUNTER — Encounter: Payer: Self-pay | Admitting: Rehabilitative and Restorative Service Providers"

## 2019-04-07 ENCOUNTER — Other Ambulatory Visit: Payer: Self-pay

## 2019-04-07 ENCOUNTER — Ambulatory Visit (INDEPENDENT_AMBULATORY_CARE_PROVIDER_SITE_OTHER): Payer: 59 | Admitting: Rehabilitative and Restorative Service Providers"

## 2019-04-07 DIAGNOSIS — R600 Localized edema: Secondary | ICD-10-CM

## 2019-04-07 DIAGNOSIS — R2689 Other abnormalities of gait and mobility: Secondary | ICD-10-CM

## 2019-04-07 DIAGNOSIS — M6281 Muscle weakness (generalized): Secondary | ICD-10-CM

## 2019-04-07 DIAGNOSIS — M25572 Pain in left ankle and joints of left foot: Secondary | ICD-10-CM

## 2019-04-07 NOTE — Therapy (Signed)
[place], [place], [address] Phone: [phone]   Fax:  [phone]  Physical Therapy Treatment  Patient Details  Name: Trenell Moxey MRN: [id number] Date of Birth: [birth year] Referring Provider (PT): Wylene Simmer, MD   Encounter Date: 04/07/2019  PT End of Session - 04/07/19 1132    Visit Number  12    Number of Visits  16    Date for PT Re-Evaluation  04/24/19    Authorization Type  [place] choice    PT Start Time  1015    PT Stop Time  1115    PT Time Calculation (min)  60 min    Activity Tolerance  Patient tolerated treatment well    Behavior During Therapy  [facility] for tasks assessed/performed       Past Medical History:  Diagnosis Date  . Hypertension   . Osteopenia   . Pneumonia     Past Surgical History:  Procedure Laterality Date  . ABDOMINAL HYSTERECTOMY    . CESAREAN SECTION     twice  . COLONOSCOPY  2014  . ENDOMETRIAL ABLATION  2005  . OOPHORECTOMY Left 1998  . ORIF ANKLE FRACTURE Left 12/16/2018   Procedure: OPEN REDUCTION INTERNAL FIXATION (ORIF) Left ankle trimalleolar fracture;  Surgeon: Wylene Simmer, MD;  Location: [facility];  Service: Orthopedics;  Laterality: Left;  55mn  . TUBAL LIGATION  1998    There were no vitals filed for this visit.  Subjective Assessment - 04/07/19 1038    Subjective  The patient reports no pain at rest and some soreness after therapy.  She has been mobilizing her great toe into extension and notes some improvement.    Patient Stated Goals  walk without boot, return to work (being on feet 12 hours)    Currently in Pain?  No/denies         [facility] - [facility]      Assessment   Medical Diagnosis  L ankle trimalleolar fracture, internal fixation and L deltoid ligament repair    Referring Provider (PT)  JWylene Simmer MD    Onset Date/Surgical Date  12/06/18    Next MD Visit  04/22/19    Prior  Therapy  none      Precautions   Precautions  Fall    Precaution Comments  due to injury, weakness L ankle      Restrictions   Weight Bearing Restrictions  Yes    LLE Weight Bearing  Weight bearing as tolerated                   [facility] Adult PT Treatment/Exercise - 04/07/19 1552      Exercises   Exercises  Ankle      Vasopneumatic   Number Minutes Vasopneumatic   10 minutes    Vasopnuematic Location   Ankle    Vasopneumatic Pressure  Medium    Vasopneumatic Temperature   34 deg      Manual Therapy   Manual Therapy  Joint mobilization;Soft tissue mobilization;Myofascial release;Passive ROM;Taping    Manual therapy comments  I strip of reg rock tape under arch along posterior tib tendon    Joint Mobilization  grade II-III posterior drawer, ankle eversion/inversion; great toe extension/flexion mobilizaton at end range grade III-IV    Soft tissue mobilization  scar tissue mobilization    Myofascial Release  plantar surface myofascial release    Passive ROM  L great toe  flexion/extension      Ankle Exercises: Stretches   Soleus Stretch  2 reps;30 seconds    Gastroc Stretch  2 reps;30 seconds    Slant Board Stretch  2 reps;30 seconds      Ankle Exercises: Aerobic   Tread Mill  1.5 mph x 5 minutes at 1-3% incline without pain      Ankle Exercises: Standing   Side Shuffle (Round Trip)  Sidestepping with cross x band for hip and ankle control *added to HEP    Other Standing Ankle Exercises  Steps downs from 4" and 6" step with mobilization with movement using belt around distal tibia to gain greater anterior translation of the tibia over the talocrural complex.      Other Standing Ankle Exercises  *step downs emphasizing heel touch as patient uses toe touch to reduce motion she allows      Ankle Exercises: Seated   Other Seated Ankle Exercises  Ball roll on tennis ball and then toe flexion/extnesion x 10 reps with passive overpressure into great toe flexion              PT Education - 04/07/19 1129    Education Details  HEP issued red band for cross resistance side stepping; added home walking progra    Person(s) Educated  Patient    Methods  Explanation;Demonstration;Handout    Comprehension  Verbalized understanding;Returned demonstration       PT Short Term Goals - 03/20/19 1227      PT SHORT TERM GOAL #1   Title  The patient will be indep with initial HEP.    Time  4    Period  Weeks    Status  Achieved    Target Date  03/29/19      PT SHORT TERM GOAL #2   Title  The patient will improve AROM left ankle to be -10 degrees from neutral DF.    Time  4    Period  Weeks    Status  Achieved    Target Date  03/29/19      PT SHORT TERM GOAL #3   Title  The patient will ambulate in her home without cam walker independently per report.    Time  4    Period  Weeks    Status  Achieved    Target Date  03/29/19      PT SHORT TERM GOAL #4   Title  The patient will be further assessed on stairs and gait speed and LTGs to follow.    Time  4    Period  Weeks    Status  On-going    Target Date  03/29/19      PT SHORT TERM GOAL #5   Title  The patient will return demo scar tissue mobilization.    Time  4    Period  Weeks    Status  Achieved    Target Date  03/29/19        PT Long Term Goals - 04/03/19 1114      PT LONG TERM GOAL #1   Title  The patient will be indep with progression of HEP.    Time  8    Period  Weeks    Status  On-going      PT LONG TERM GOAL #2   Title  The patient will reduce limitation per FOTO from 46% down to 25%.    Time  8    Period  Weeks  Status  On-going      PT LONG TERM GOAL #3   Title  The patient will improve L ankle AROM to neutral position of DF.    Time  8    Period  Weeks    Status  Achieved      PT LONG TERM GOAL #4   Title  The patient will negotiate steps with reciprocal pattern independently.    Time  8    Period  Weeks    Status  Partially Met      PT LONG TERM  GOAL #5   Title  The patient will be able to tolerate prolonged standing > 2 hours to demo ability to be on her feet x hours at work.    Time  8    Period  Weeks    Status  On-going            Plan - 04/07/19 1557    Clinical Impression Statement  The patient is gaining mobility now increasing her standing tolerance t/o the day.  PT progressed general activity recommending initiation of a home walking program, progression of standing hip abduction (as trendelenberg present during gait bilaterally).  PT to continue working on standing strengthening and stability.    Examination-Activity Limitations  Stairs;Stand;Locomotion Level    Examination-Participation Restrictions  Community Activity    Stability/Clinical Decision Making  Stable/Uncomplicated    Rehab Potential  Good    PT Frequency  2x / week    PT Duration  8 weeks    PT Treatment/Interventions  ADLs/Self Care Home Management;Iontophoresis 16m/ml Dexamethasone;Ultrasound;Cryotherapy;Electrical Stimulation;Moist Heat;Balance training;Therapeutic activities;Therapeutic exercise;Functional mobility training;Gait training;Stair training;Patient/family education;Taping;Manual techniques;Passive range of motion;Dry needling;Vasopneumatic Device    PT Next Visit Plan  continue progressive ROM and WB strengthening for Lt ankle.    PT Home Exercise Plan  Access Code: [email or web address]    Consulted and Agree with Plan of Care  Patient       Patient will benefit from skilled therapeutic intervention in order to improve the following deficits and impairments:  Abnormal gait, Decreased range of motion, Difficulty walking, Decreased activity tolerance, Hypomobility, Impaired flexibility, Pain, Decreased scar mobility, Decreased strength, Impaired sensation  Visit Diagnosis: Pain in left ankle and joints of left foot  Muscle weakness (generalized)  Other abnormalities of gait and mobility  Localized edema     Problem List Patient Active  Problem List   Diagnosis Date Noted  . Hypertension 10/02/2016  . Enlarged lymph node 07/02/2014  . Osteopenia 10/20/2012  . Routine general medical examination at a health care facility 09/29/2012    [place] PT 04/07/2019, 3:59 PM  [facility] [address]Phone: [phone]  Fax:  [phone] Name: JMakaiyah SchweigerMRN: [id number] of Birth: 9Jan 23, 1962

## 2019-04-07 NOTE — Patient Instructions (Signed)
*  don't start in squat position, begin with general sidestep and we will add squat later  10 x to each side

## 2019-04-10 ENCOUNTER — Ambulatory Visit (INDEPENDENT_AMBULATORY_CARE_PROVIDER_SITE_OTHER): Payer: 59 | Admitting: Physical Therapy

## 2019-04-10 ENCOUNTER — Other Ambulatory Visit: Payer: Self-pay

## 2019-04-10 DIAGNOSIS — R2689 Other abnormalities of gait and mobility: Secondary | ICD-10-CM | POA: Diagnosis not present

## 2019-04-10 DIAGNOSIS — R600 Localized edema: Secondary | ICD-10-CM | POA: Diagnosis not present

## 2019-04-10 DIAGNOSIS — M25572 Pain in left ankle and joints of left foot: Secondary | ICD-10-CM | POA: Diagnosis not present

## 2019-04-10 DIAGNOSIS — M6281 Muscle weakness (generalized): Secondary | ICD-10-CM | POA: Diagnosis not present

## 2019-04-10 NOTE — Therapy (Signed)
[place], [place], [address] Phone: [phone]   Fax:  [phone]  Physical Therapy Treatment  Patient Details  Name: Dominique Rice MRN: [id number] Date of Birth: 11/06/1960 Referring Provider (PT): Wylene Simmer, MD   Encounter Date: 04/10/2019  PT End of Session - 04/10/19 1144    Visit Number  13    Number of Visits  16    Date for PT Re-Evaluation  04/24/19    Authorization Type  [place] choice    PT Start Time  1104    PT Stop Time  1150    PT Time Calculation (min)  46 min    Activity Tolerance  Patient tolerated treatment well    Behavior During Therapy  [facility] for tasks assessed/performed       Past Medical History:  Diagnosis Date  . Hypertension   . Osteopenia   . Pneumonia     Past Surgical History:  Procedure Laterality Date  . ABDOMINAL HYSTERECTOMY    . CESAREAN SECTION     twice  . COLONOSCOPY  2014  . ENDOMETRIAL ABLATION  2005  . OOPHORECTOMY Left 1998  . ORIF ANKLE FRACTURE Left 12/16/2018   Procedure: OPEN REDUCTION INTERNAL FIXATION (ORIF) Left ankle trimalleolar fracture;  Surgeon: Wylene Simmer, MD;  Location: [facility];  Service: Orthopedics;  Laterality: Left;  89mn  . TUBAL LIGATION  1998    There were no vitals filed for this visit.  Subjective Assessment - 04/10/19 1103    Subjective  Pt reports no new changes since last visit.  She has been doing 10 min walks around neighborhood.  Main complaint is stiffness in Lt ankle.    Patient Stated Goals  walk without boot, return to work (being on feet 12 hours)    Currently in Pain?  No/denies    Pain Score  0-No pain         [facility] PT Assessment - 04/10/19 0001      Assessment   Medical Diagnosis  L ankle trimalleolar fracture, internal fixation and L deltoid ligament repair    Referring Provider (PT)  JWylene Simmer MD    Onset Date/Surgical Date  12/06/18    Next MD Visit  04/22/19     Prior Therapy  none       [facility] Adult PT Treatment/Exercise - 04/10/19 0001      Vasopneumatic   Number Minutes Vasopneumatic   10 minutes    Vasopnuematic Location   Ankle    Vasopneumatic Pressure  Medium    Vasopneumatic Temperature   34 deg      Manual Therapy   Joint Mobilization  grade II-III posterior drawer, ankle eversion/inversion; great toe extension/flexion mobilizaton at end range grade III    Passive ROM  L great toe flexion/extension      Kinesiotix   Create Space  I strip of reg Rock tape applied over Lt lateral ankle incision in zigzag pattern for scar managment.  I strip applied with 20% stretch at Lt medial ankle and following post tib/ flexor digitorum longus with perpendicular strips with 50% stretch applied to areas of increased pain.           Ankle Exercises: Aerobic   Nustep  L4 x 4 minutes LEs only, for ankle ROM   PTA present to monitor and discuss progress   Other Aerobic  lap around gym (80 ft) in between exercises to decrease stiffness.  Ankle Exercises: Stretches   Plantar Fascia Stretch  1 rep;30 seconds   LLE on prostretch   Soleus Stretch  2 reps;30 seconds      Ankle Exercises: Standing   BAPS  Standing;Level 2;10 reps   PF/DF, CW/CCW, with UE support    SLS  Lt SLS on blue pad with intermittent support x 20 sec x 2 reps    Other Standing Ankle Exercises  Steps downs from 4" step with mobilization with movement using black band around distal tibia to gain greater anterior translation of the tibia over the talocrural complex.      Other Standing Ankle Exercises  forward step downs touching Rt heel down x 8 reps      Ankle Exercises: Seated   Other Seated Ankle Exercises  Ball roll on tennis ball and then toe flexion/extnesion x 5 reps with passive overpressure into great toe flexion      Ankle Exercises: Supine   T-Band  Lt ankle, DF/eversion/ inversion x 10 reps, 2 sets with red band.                PT Short Term Goals -  03/20/19 1227      PT SHORT TERM GOAL #1   Title  The patient will be indep with initial HEP.    Time  4    Period  Weeks    Status  Achieved    Target Date  03/29/19      PT SHORT TERM GOAL #2   Title  The patient will improve AROM left ankle to be -10 degrees from neutral DF.    Time  4    Period  Weeks    Status  Achieved    Target Date  03/29/19      PT SHORT TERM GOAL #3   Title  The patient will ambulate in her home without cam walker independently per report.    Time  4    Period  Weeks    Status  Achieved    Target Date  03/29/19      PT SHORT TERM GOAL #4   Title  The patient will be further assessed on stairs and gait speed and LTGs to follow.    Time  4    Period  Weeks    Status  On-going    Target Date  03/29/19      PT SHORT TERM GOAL #5   Title  The patient will return demo scar tissue mobilization.    Time  4    Period  Weeks    Status  Achieved    Target Date  03/29/19        PT Long Term Goals - 04/03/19 1114      PT LONG TERM GOAL #1   Title  The patient will be indep with progression of HEP.    Time  8    Period  Weeks    Status  On-going      PT LONG TERM GOAL #2   Title  The patient will reduce limitation per FOTO from 46% down to 25%.    Time  8    Period  Weeks    Status  On-going      PT LONG TERM GOAL #3   Title  The patient will improve L ankle AROM to neutral position of DF.    Time  8    Period  Weeks    Status  Achieved  PT LONG TERM GOAL #4   Title  The patient will negotiate steps with reciprocal pattern independently.    Time  8    Period  Weeks    Status  Partially Met      PT LONG TERM GOAL #5   Title  The patient will be able to tolerate prolonged standing > 2 hours to demo ability to be on her feet x hours at work.    Time  8    Period  Weeks    Status  On-going            Plan - 04/10/19 1307    Clinical Impression Statement  Pt able to tolerate standing BAPS board, as well as increased  resistance with ankle exercises. Continued limited ROM and strength in Lt great toe.  Pt progressing well towards all LTGs.    Examination-Activity Limitations  Stairs;Stand;Locomotion Level    Examination-Participation Restrictions  Community Activity    Stability/Clinical Decision Making  Stable/Uncomplicated    Rehab Potential  Good    PT Frequency  2x / week    PT Duration  8 weeks    PT Treatment/Interventions  ADLs/Self Care Home Management;Iontophoresis 38m/ml Dexamethasone;Ultrasound;Cryotherapy;Electrical Stimulation;Moist Heat;Balance training;Therapeutic activities;Therapeutic exercise;Functional mobility training;Gait training;Stair training;Patient/family education;Taping;Manual techniques;Passive range of motion;Dry needling;Vasopneumatic Device    PT Next Visit Plan  continue progressive ROM and WB strengthening for Lt ankle.    PT Home Exercise Plan  Access Code: [email or web address]    Consulted and Agree with Plan of Care  Patient       Patient will benefit from skilled therapeutic intervention in order to improve the following deficits and impairments:  Abnormal gait, Decreased range of motion, Difficulty walking, Decreased activity tolerance, Hypomobility, Impaired flexibility, Pain, Decreased scar mobility, Decreased strength, Impaired sensation  Visit Diagnosis: Pain in left ankle and joints of left foot  Muscle weakness (generalized)  Other abnormalities of gait and mobility  Localized edema     Problem List Patient Active Problem List   Diagnosis Date Noted  . Hypertension 10/02/2016  . Enlarged lymph node 07/02/2014  . Osteopenia 10/20/2012  . Routine general medical examination at a health care facility 09/29/2012   JKerin Perna PTA 04/10/19 1:17 PM  [place] [address]Phone: [phone]  Fax:  [phone] Name: JClydie DillenMRN: [id number]  of Birth: [birth year]

## 2019-04-14 ENCOUNTER — Other Ambulatory Visit: Payer: Self-pay

## 2019-04-14 ENCOUNTER — Encounter: Payer: Self-pay | Admitting: Rehabilitative and Restorative Service Providers"

## 2019-04-14 ENCOUNTER — Ambulatory Visit (INDEPENDENT_AMBULATORY_CARE_PROVIDER_SITE_OTHER): Payer: 59 | Admitting: Rehabilitative and Restorative Service Providers"

## 2019-04-14 DIAGNOSIS — M6281 Muscle weakness (generalized): Secondary | ICD-10-CM | POA: Diagnosis not present

## 2019-04-14 DIAGNOSIS — R600 Localized edema: Secondary | ICD-10-CM

## 2019-04-14 DIAGNOSIS — R2689 Other abnormalities of gait and mobility: Secondary | ICD-10-CM | POA: Diagnosis not present

## 2019-04-14 DIAGNOSIS — M25572 Pain in left ankle and joints of left foot: Secondary | ICD-10-CM | POA: Diagnosis not present

## 2019-04-14 NOTE — Therapy (Signed)
[place], [place], [address] Phone: [phone]   Fax:  [phone]  Physical Therapy Treatment  Patient Details  Name: Dominique Rice MRN: [id number] Date of Birth: 12-03-1960 Referring Provider (PT): Wylene Simmer, MD   Encounter Date: 04/14/2019  PT End of Session - 04/14/19 1122    Visit Number  14    Number of Visits  16    Date for PT Re-Evaluation  04/24/19    Authorization Type   choice    PT Start Time  1018    PT Stop Time  1108    PT Time Calculation (min)  50 min    Activity Tolerance  Patient tolerated treatment well    Behavior During Therapy  [facility] for tasks assessed/performed       Past Medical History:  Diagnosis Date  . Hypertension   . Osteopenia   . Pneumonia     Past Surgical History:  Procedure Laterality Date  . ABDOMINAL HYSTERECTOMY    . CESAREAN SECTION     twice  . COLONOSCOPY  2014  . ENDOMETRIAL ABLATION  2005  . OOPHORECTOMY Left 1998  . ORIF ANKLE FRACTURE Left 12/16/2018   Procedure: OPEN REDUCTION INTERNAL FIXATION (ORIF) Left ankle trimalleolar fracture;  Surgeon: Wylene Simmer, MD;  Location: [facility];  Service: Orthopedics;  Laterality: Left;  11mn  . TUBAL LIGATION  1998    There were no vitals filed for this visit.  Subjective Assessment - 04/14/19 1023    Subjective  The patient is up to 15 minutes of walking nonstop.  She notes some lateral hip pain (L) when up for awhile (maybe 1-2 hours).  Main c/o of stiffness into DF.    Patient Stated Goals  walk without boot, return to work (being on feet 12 hours)    Currently in Pain?  No/denies                       [facility]/Exercise - 04/14/19 1026      Exercises   Exercises  Ankle      Vasopneumatic   Number Minutes Vasopneumatic   10 minutes    Vasopnuematic Location   Ankle    Vasopneumatic Pressure  Medium    Vasopneumatic  Temperature   34 deg      Manual Therapy   Manual Therapy  Joint mobilization;Soft tissue mobilization;Taping    Manual therapy comments  To gain great toe flexion/extension, increase ankle ROM, decrease myofascial tightness and improve mobility    Joint Mobilization  grade III-IV great toe flexion/extension, DIP flexion with overpressure; ankle grade II mob inversion/eversion talocrural joint and posterior drawer    Soft tissue mobilization  soleous STM    Myofascial Release  plantar surface myofascial release    Passive ROM  L great toe flexion/extension with overpressure    Kinesiotex  Facilitate Muscle      Kinesiotix   Create Space  I stric of rock tape lateral foot border > under arch> along posterior tibialist tendon      Ankle Exercises: Stretches   Soleus Stretch  2 reps;30 seconds    Gastroc Stretch  2 reps;30 seconds      Ankle Exercises: Aerobic   Tread Mill  1.6 mph x 4 minutes at 3% incline      Ankle Exercises: Standing   Other Standing Ankle Exercises  Step downs x 12 reps leading  with R LE for ankle DF L.    Other Standing Ankle Exercises  Agility ladder:  Wide to narrow marching x 20 reps, diagonal marching through agility ladder, sidestepping through ladder, and anterior/posterior marching.  Instead of hopping into/out of box, began faster stepping maintaining weight through toes in preparation for plyometric training.               PT Short Term Goals - 03/20/19 1227      PT SHORT TERM GOAL #1   Title  The patient will be indep with initial HEP.    Time  4    Period  Weeks    Status  Achieved    Target Date  03/29/19      PT SHORT TERM GOAL #2   Title  The patient will improve AROM left ankle to be -10 degrees from neutral DF.    Time  4    Period  Weeks    Status  Achieved    Target Date  03/29/19      PT SHORT TERM GOAL #3   Title  The patient will ambulate in her home without cam walker independently per report.    Time  4    Period  Weeks     Status  Achieved    Target Date  03/29/19      PT SHORT TERM GOAL #4   Title  The patient will be further assessed on stairs and gait speed and LTGs to follow.    Time  4    Period  Weeks    Status  On-going    Target Date  03/29/19      PT SHORT TERM GOAL #5   Title  The patient will return demo scar tissue mobilization.    Time  4    Period  Weeks    Status  Achieved    Target Date  03/29/19        PT Long Term Goals - 04/03/19 1114      PT LONG TERM GOAL #1   Title  The patient will be indep with progression of HEP.    Time  8    Period  Weeks    Status  On-going      PT LONG TERM GOAL #2   Title  The patient will reduce limitation per FOTO from 46% down to 25%.    Time  8    Period  Weeks    Status  On-going      PT LONG TERM GOAL #3   Title  The patient will improve L ankle AROM to neutral position of DF.    Time  8    Period  Weeks    Status  Achieved      PT LONG TERM GOAL #4   Title  The patient will negotiate steps with reciprocal pattern independently.    Time  8    Period  Weeks    Status  Partially Met      PT LONG TERM GOAL #5   Title  The patient will be able to tolerate prolonged standing > 2 hours to demo ability to be on her feet x hours at work.    Time  8    Period  Weeks    Status  On-going            Plan - 04/14/19 1122    Clinical Impression Statement  The patient is tolerating multi-directional stepping through  agility ladder working up to loading toes during stepping.  She continues with general tightness and feels great toe extension limits gait.  Patient is tolerating home walking program up to 15 minutes/walk and we discussed increase to 20 minutes/walk over the next week.  May consider static progressive splint for great toe extension (perhaps JAS splint).  Patient continuing to work on home stretching.    Examination-Activity Limitations  Stairs;Stand;Locomotion Level    Examination-Participation Restrictions  Community  Activity    Stability/Clinical Decision Making  Stable/Uncomplicated    Rehab Potential  Good    PT Frequency  2x / week    PT Duration  8 weeks    PT Treatment/Interventions  ADLs/Self Care Home Management;Iontophoresis 52m/ml Dexamethasone;Ultrasound;Cryotherapy;Electrical Stimulation;Moist Heat;Balance training;Therapeutic activities;Therapeutic exercise;Functional mobility training;Gait training;Stair training;Patient/family education;Taping;Manual techniques;Passive range of motion;Dry needling;Vasopneumatic Device    PT Next Visit Plan  continue progressive ROM and WB strengthening for Lt ankle.    PT Home Exercise Plan  Access Code: [email or web address]    Consulted and Agree with Plan of Care  Patient       Patient will benefit from skilled therapeutic intervention in order to improve the following deficits and impairments:  Abnormal gait, Decreased range of motion, Difficulty walking, Decreased activity tolerance, Hypomobility, Impaired flexibility, Pain, Decreased scar mobility, Decreased strength, Impaired sensation  Visit Diagnosis: Pain in left ankle and joints of left foot  Muscle weakness (generalized)  Other abnormalities of gait and mobility  Localized edema     Problem List Patient Active Problem List   Diagnosis Date Noted  . Hypertension 10/02/2016  . Enlarged lymph node 07/02/2014  . Osteopenia 10/20/2012  . Routine general medical examination at a health care facility 09/29/2012    WAshippun PBrule3/30/2021, 11:24 AM  [facility] [address]Phone: [phone]  Fax:  [phone] Name: JAyda TancrediMRN: [id number] of Birth: [birth year]

## 2019-04-16 ENCOUNTER — Other Ambulatory Visit: Payer: Self-pay

## 2019-04-16 ENCOUNTER — Ambulatory Visit (INDEPENDENT_AMBULATORY_CARE_PROVIDER_SITE_OTHER): Payer: 59 | Admitting: Rehabilitative and Restorative Service Providers"

## 2019-04-16 ENCOUNTER — Encounter: Payer: Self-pay | Admitting: Rehabilitative and Restorative Service Providers"

## 2019-04-16 DIAGNOSIS — R2689 Other abnormalities of gait and mobility: Secondary | ICD-10-CM

## 2019-04-16 DIAGNOSIS — R600 Localized edema: Secondary | ICD-10-CM

## 2019-04-16 DIAGNOSIS — M25572 Pain in left ankle and joints of left foot: Secondary | ICD-10-CM | POA: Diagnosis not present

## 2019-04-16 DIAGNOSIS — M6281 Muscle weakness (generalized): Secondary | ICD-10-CM

## 2019-04-16 NOTE — Therapy (Signed)
[place], [place], [address] Phone: [phone]   Fax:  [phone]  Physical Therapy Treatment  Patient Details  Name: Dominique Rice MRN: [id number] Date of Birth: May 11, 1960 Referring Provider (PT): Wylene Simmer, MD   Encounter Date: 04/16/2019  PT End of Session - 04/16/19 1110    Visit Number  15    Number of Visits  16    Date for PT Re-Evaluation  04/24/19    Authorization Type  [place] choice    PT Start Time  1105    PT Stop Time  1145    PT Time Calculation (min)  40 min    Activity Tolerance  Patient tolerated treatment well    Behavior During Therapy  [facility] for tasks assessed/performed       Past Medical History:  Diagnosis Date  . Hypertension   . Osteopenia   . Pneumonia     Past Surgical History:  Procedure Laterality Date  . ABDOMINAL HYSTERECTOMY    . CESAREAN SECTION     twice  . COLONOSCOPY  2014  . ENDOMETRIAL ABLATION  2005  . OOPHORECTOMY Left 1998  . ORIF ANKLE FRACTURE Left 12/16/2018   Procedure: OPEN REDUCTION INTERNAL FIXATION (ORIF) Left ankle trimalleolar fracture;  Surgeon: Wylene Simmer, MD;  Location: [facility];  Service: Orthopedics;  Laterality: Left;  65mn  . TUBAL LIGATION  1998    There were no vitals filed for this visit.  Subjective Assessment - 04/16/19 1109    Subjective  The patient reports she has increased stiffness today-- wasn't able to walk yesterday due to weather.    Patient Stated Goals  walk without boot, return to work (being on feet 12 hours)    Currently in Pain?  No/denies                       [facility]/Exercise - 04/16/19 1115      Exercises   Exercises  Ankle      Modalities   Modalities  Vasopneumatic;Electrical Stimulation      Electrical Stimulation   Electrical Stimulation Location  L extensor hallucis longus     Electrical Stimulation Action  russian current  for denervation    Electrical Stimulation Parameters  to tolerance    Electrical Stimulation Goals  Strength;Neuromuscular facilitation;Other (comment)   to attempt to elicit greater active toe extension     Vasopneumatic   Number Minutes Vasopneumatic   10 minutes    Vasopnuematic Location   Ankle    Vasopneumatic Pressure  Medium    Vasopneumatic Temperature   34 deg      Ankle Exercises: Stretches   Other Stretch  Performed plank position working on getting L toe extended to support weight and lifting knees/hips (on elbows), performed start of down dog in quadriped lifting knees and hips      Ankle Exercises: Aerobic   Nustep  L5 x 5 minutes for warm up    Other Aerobic  used laps around the gym in between exercises to stretch ankle      Ankle Exercises: Standing   SLS  single leg stance on blue foam dec'ing UE support, then in 1/2 tandem position for ankle med/lateral control.      Other Standing Ankle Exercises  Step downs x 10 reps from 6" step with bilat UE support tapping R heel.  Sidestep lunges x 10 reps with green  theraband.    Other Standing Ankle Exercises  Step downs laterally with L UE on blue foam x 12 reps      Ankle Exercises: Seated   Ankle Circles/Pumps Limitations  toe lifts in conjunction with e-stim    Other Seated Ankle Exercises  tall kneeling with anterior foot supported on physiodisc performing rythmic stabilization and UE proactive movements               PT Short Term Goals - 03/20/19 1227      PT SHORT TERM GOAL #1   Title  The patient will be indep with initial HEP.    Time  4    Period  Weeks    Status  Achieved    Target Date  03/29/19      PT SHORT TERM GOAL #2   Title  The patient will improve AROM left ankle to be -10 degrees from neutral DF.    Time  4    Period  Weeks    Status  Achieved    Target Date  03/29/19      PT SHORT TERM GOAL #3   Title  The patient will ambulate in her home without cam walker independently per  report.    Time  4    Period  Weeks    Status  Achieved    Target Date  03/29/19      PT SHORT TERM GOAL #4   Title  The patient will be further assessed on stairs and gait speed and LTGs to follow.    Time  4    Period  Weeks    Status  On-going    Target Date  03/29/19      PT SHORT TERM GOAL #5   Title  The patient will return demo scar tissue mobilization.    Time  4    Period  Weeks    Status  Achieved    Target Date  03/29/19        PT Long Term Goals - 04/16/19 1111      PT LONG TERM GOAL #1   Title  The patient will be indep with progression of HEP.    Time  8    Period  Weeks    Status  On-going    Target Date  04/24/19      PT LONG TERM GOAL #2   Title  The patient will reduce limitation per FOTO from 46% down to 25%.    Time  8    Period  Weeks    Status  On-going      PT LONG TERM GOAL #3   Title  The patient will improve L ankle AROM to neutral position of DF.    Time  8    Period  Weeks    Status  Achieved      PT LONG TERM GOAL #4   Title  The patient will negotiate steps with reciprocal pattern independently.    Time  8    Period  Weeks    Status  Partially Met      PT LONG TERM GOAL #5   Title  The patient will be able to tolerate prolonged standing > 2 hours to demo ability to be on her feet x hours at work.    Baseline  doing home activities of cooking, shopping > 2 hours.    Time  8    Period  Weeks    Status  Achieved            Plan - 04/16/19 1301    Clinical Impression Statement  The patient has met 2 LTGs.  PT will check other LTGs over the next week and plan to update plan of care/renew for 4 more weeks.  She is progressing well with return to function increasing walking tolerance to 20 minutes.  Her L great toe continues with limitations in extension and she may benefit from JAS splint to continue at home with static progressive stretching.    Examination-Activity Limitations  Stairs;Stand;Locomotion Level     Examination-Participation Restrictions  Community Activity    Stability/Clinical Decision Making  Stable/Uncomplicated    Rehab Potential  Good    PT Frequency  2x / week    PT Duration  8 weeks    PT Treatment/Interventions  ADLs/Self Care Home Management;Iontophoresis 59m/ml Dexamethasone;Ultrasound;Cryotherapy;Electrical Stimulation;Moist Heat;Balance training;Therapeutic activities;Therapeutic exercise;Functional mobility training;Gait training;Stair training;Patient/family education;Taping;Manual techniques;Passive range of motion;Dry needling;Vasopneumatic Device    PT Next Visit Plan  continue progressive ROM and WB strengthening for Lt ankle.    PT Home Exercise Plan  Access Code: [email or web address]    Consulted and Agree with Plan of Care  Patient       Patient will benefit from skilled therapeutic intervention in order to improve the following deficits and impairments:  Abnormal gait, Decreased range of motion, Difficulty walking, Decreased activity tolerance, Hypomobility, Impaired flexibility, Pain, Decreased scar mobility, Decreased strength, Impaired sensation  Visit Diagnosis: Pain in left ankle and joints of left foot  Muscle weakness (generalized)  Other abnormalities of gait and mobility  Localized edema     Problem List Patient Active Problem List   Diagnosis Date Noted  . Hypertension 10/02/2016  . Enlarged lymph node 07/02/2014  . Osteopenia 10/20/2012  . Routine general medical examination at a health care facility 09/29/2012    WM[facility]ows Place4/01/2019, 1:09 PM  [facility] [address]Phone: [phone]  Fax:  [phone] Name: Dominique CrillMRN: [id number] of Birth: 907/14/1962

## 2019-04-21 ENCOUNTER — Ambulatory Visit (INDEPENDENT_AMBULATORY_CARE_PROVIDER_SITE_OTHER): Payer: 59 | Admitting: Physical Therapy

## 2019-04-21 ENCOUNTER — Encounter: Payer: Self-pay | Admitting: Physical Therapy

## 2019-04-21 ENCOUNTER — Other Ambulatory Visit: Payer: Self-pay

## 2019-04-21 DIAGNOSIS — R2689 Other abnormalities of gait and mobility: Secondary | ICD-10-CM | POA: Diagnosis not present

## 2019-04-21 DIAGNOSIS — M6281 Muscle weakness (generalized): Secondary | ICD-10-CM | POA: Diagnosis not present

## 2019-04-21 DIAGNOSIS — R600 Localized edema: Secondary | ICD-10-CM | POA: Diagnosis not present

## 2019-04-21 DIAGNOSIS — M25572 Pain in left ankle and joints of left foot: Secondary | ICD-10-CM | POA: Diagnosis not present

## 2019-04-21 NOTE — Addendum Note (Signed)
Addended by: Margretta Ditty M on: 04/21/2019 03:21 PM   Modules accepted: Orders

## 2019-04-21 NOTE — Therapy (Addendum)
[place], [place], [address] Phone: [phone]   Fax:  [phone]  Physical Therapy Treatment and Renewal Summary/Discharge Summary  Patient Details  Name: Dominique Rice MRN: [id number] Date of Birth: 11-03-1960 Referring Provider (PT): Wylene Simmer, MD   Encounter Date: 04/21/2019  PT End of Session - 04/21/19 1104    Visit Number  16    Number of Visits  16    Date for PT Re-Evaluation  04/24/19    Authorization Type   choice    PT Start Time  1058    PT Stop Time  1145    PT Time Calculation (min)  47 min    Activity Tolerance  Patient tolerated treatment well    Behavior During Therapy  [facility] for tasks assessed/performed       Past Medical History:  Diagnosis Date  . Hypertension   . Osteopenia   . Pneumonia     Past Surgical History:  Procedure Laterality Date  . ABDOMINAL HYSTERECTOMY    . CESAREAN SECTION     twice  . COLONOSCOPY  2014  . ENDOMETRIAL ABLATION  2005  . OOPHORECTOMY Left 1998  . ORIF ANKLE FRACTURE Left 12/16/2018   Procedure: OPEN REDUCTION INTERNAL FIXATION (ORIF) Left ankle trimalleolar fracture;  Surgeon: Wylene Simmer, MD;  Location: [facility];  Service: Orthopedics;  Laterality: Left;  42mn  . TUBAL LIGATION  1998    There were no vitals filed for this visit.  Subjective Assessment - 04/21/19 1101    Subjective  Pt has been walking 1 mile in neighborhood, (20 min) 2x/day.  Stil has difficulty with stairs.  Pt voices concern of numbness in Lt toes and "floppy" great toe.    Patient Stated Goals  walk without boot, return to work (being on feet 12 hours)    Currently in Pain?  No/denies   just stiffness.        [facility] - 04[facility] 0001      Assessment   Medical Diagnosis  L ankle trimalleolar fracture, internal fixation and L deltoid ligament repair    Referring Provider (PT)  JWylene Simmer MD    Onset  Date/Surgical Date  12/06/18    Next MD Visit  04/22/19    Prior Therapy  none      AROM   Overall AROM Comments  -2 deg active Lt great toe ext.     Left Ankle Dorsiflexion  0    Left Ankle Plantar Flexion  52    Left Ankle Inversion  30    Left Ankle Eversion  14      PROM   Overall PROM Comments  65 deg PROM ext of great Lt toe;        Strength   Strength Assessment Site  Ankle    Right/Left Ankle  Left    Left Ankle Dorsiflexion  3+/5    Left Ankle Inversion  5/5    Left Ankle Eversion  4/5        [facility] Adult PT Treatment/Exercise - 04/21/19 0001      Knee/Hip Exercises: Standing   Stairs  reciprocal pattern - up/down 13 steps with unilateral UE support on rail x 3 reps      Manual Therapy   Manual therapy comments  To gain great toe flexion/extension, increase ankle ROM, decrease myofascial tightness and improve mobility    Soft tissue mobilization  Lt soleous and post  tib STM    Myofascial Release  plantar surface myofascial release    Passive ROM  L great toe flexion/extension with overpressure, Lt ankle into DF      [employer]   Create Space  I strip of rock tape lateral foot border > under arch> along posterior tibialist tendon      Ankle Exercises: Aerobic   Nustep  L5: 5.5 min for warm up.     Other Aerobic  laps around the gym in between exercises to stretch ankle      Ankle Exercises: Stretches   Soleus Stretch  2 reps;30 seconds    Gastroc Stretch  2 reps;30 seconds    Other Stretch  Performed plank position working on getting L toe extended to support weight and lifting knees/hips (on elbows), performed start of down dog in quadriped lifting knees and hips, 8 sec holds x 3 reps    Other Stretch  forward lunge in high kneeling to stretch Lt ankle into DF x 15 sec x 3 reps.       Ankle Exercises: Standing   Other Standing Ankle Exercises  lateral step up/down 6" step x 12 reps;  step downs on 6" step and retro step up x 10 reps                PT  Short Term Goals - 04/21/19 1314      PT SHORT TERM GOAL #1   Title  The patient will be indep with initial HEP.    Time  4    Period  Weeks    Status  Achieved    Target Date  03/29/19      PT SHORT TERM GOAL #2   Title  The patient will improve AROM left ankle to be -10 degrees from neutral DF.    Time  4    Period  Weeks    Status  Achieved    Target Date  03/29/19      PT SHORT TERM GOAL #3   Title  The patient will ambulate in her home without cam walker independently per report.    Time  4    Period  Weeks    Status  Achieved    Target Date  03/29/19      PT SHORT TERM GOAL #4   Title  The patient will be further assessed on stairs and gait speed and LTGs to follow.    Time  4    Period  Weeks    Status  Achieved    Target Date  03/29/19      PT SHORT TERM GOAL #5   Title  The patient will return demo scar tissue mobilization.    Time  4    Period  Weeks    Status  Achieved    Target Date  03/29/19        PT Long Term Goals - 04/21/19 1314      PT LONG TERM GOAL #1   Title  The patient will be indep with progression of HEP.    Time  8    Period  Weeks    Status  On-going      PT LONG TERM GOAL #2   Title  The patient will reduce limitation per FOTO from 46% down to 25%.    Time  8    Period  Weeks    Status  On-going      PT LONG TERM GOAL #3  Title  The patient will improve L ankle AROM to neutral position of DF.    Time  8    Period  Weeks    Status  Achieved      PT LONG TERM GOAL #4   Title  The patient will negotiate steps with reciprocal pattern independently.    Time  8    Period  Weeks    Status  Achieved      PT LONG TERM GOAL #5   Title  The patient will be able to tolerate prolonged standing > 2 hours to demo ability to be on her feet x hours at work.    Baseline  doing home activities of cooking, shopping > 2 hours.    Time  8    Period  Weeks    Status  Achieved       UPDATED LONG TERM GOALS:   PT Long Term Goals -  04/21/19 1517      PT LONG TERM GOAL #1   Title  The patient will be indep with progression of HEP.    Time  4    Period  Weeks    Status  Revised    Target Date  05/21/19      PT LONG TERM GOAL #2   Title  The patient will reduce limitation per FOTO from 46% down to 25%.    Time  4    Period  Weeks    Status  Revised    Target Date  05/21/19      PT LONG TERM GOAL #3   Title  The patient will tolerate 45 minutes of walking nonstop per report to demo improved tolerance to extended ambulation for return to work.    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    Time  4    Period  Weeks    Status  New    Target Date  05/21/19          Plan - 04/21/19 1306    Clinical Impression Statement  Pt is able to complete reciprocal pattern on 6" steps without difficulty.  She continues with limited Lt great toe ROM and strength. She tolerated all exercises well today, without difficulty.  Per supervising PT; pt will benefit from JAS splint to continue at home static progressive stretching as well as 4 additional weeks of PT.  Pt has met majority of LTGs and is progressing well towards remaining.    Examination-Activity Limitations  Stairs;Stand;Locomotion Level    Examination-Participation Restrictions  Community Activity    Stability/Clinical Decision Making  Stable/Uncomplicated    Rehab Potential  Good    PT Frequency  2x / week    PT Duration  8 weeks    PT Treatment/Interventions  ADLs/Self Care Home Management;Iontophoresis 5m/ml Dexamethasone;Ultrasound;Cryotherapy;Electrical Stimulation;Moist Heat;Balance training;Therapeutic activities;Therapeutic exercise;Functional mobility training;Gait training;Stair training;Patient/family education;Taping;Manual techniques;Passive range of motion;Dry needling;Vasopneumatic  Device    PT Next Visit Plan  continue progressive ROM and WB strengthening of Lt ankle to assist with return to work.    PT Home Exercise Plan  Access Code: [email or web address]   Consulted and Agree with Plan of Care  Patient       Patient will benefit  from skilled therapeutic intervention in order to improve the following deficits and impairments:  Abnormal gait, Decreased range of motion, Difficulty walking, Decreased activity tolerance, Hypomobility, Impaired flexibility, Pain, Decreased scar mobility, Decreased strength, Impaired sensation  Visit Diagnosis: Pain in left ankle and joints of left foot  Muscle weakness (generalized)  Other abnormalities of gait and mobility  Localized edema    PHYSICAL THERAPY DISCHARGE SUMMARY  Visits from Start of Care: 16  Current functional level related to goals / functional outcomes: Patient sent message via my chart noting she will be returning to work 4 hours/day next week and feels she can continue progressing her exercises.   Remaining deficits: Decreased strength great toe flexion/extension, sensory changes in L ankle/foot.   Education / Equipment: HEP  Plan: Patient agrees to discharge.  Patient goals were partially met. Patient is being discharged due to meeting the stated rehab goals.  ?????         Thank you for the referral of this patient. Rudell Cobb, MPT  Problem List Patient Active Problem List   Diagnosis Date Noted  . Hypertension 10/02/2016  . Enlarged lymph node 07/02/2014  . Osteopenia 10/20/2012  . Routine general medical examination at a health care facility 09/29/2012    Rudell Cobb, PT  Kerin Perna, PTA 04/21/19 1:16 PM  [place], [place], [address] Phone: [phone]   Fax:  [phone]  Name: Dominique Rice MRN: [id number] Date of Birth: 08/19/1960

## 2019-04-22 ENCOUNTER — Encounter: Payer: Self-pay | Admitting: Rehabilitative and Restorative Service Providers"

## 2019-04-22 DIAGNOSIS — M25572 Pain in left ankle and joints of left foot: Secondary | ICD-10-CM | POA: Diagnosis not present

## 2019-04-22 DIAGNOSIS — S82852D Displaced trimalleolar fracture of left lower leg, subsequent encounter for closed fracture with routine healing: Secondary | ICD-10-CM | POA: Diagnosis not present

## 2019-06-17 ENCOUNTER — Other Ambulatory Visit (HOSPITAL_BASED_OUTPATIENT_CLINIC_OR_DEPARTMENT_OTHER): Payer: Self-pay | Admitting: Student

## 2019-06-17 ENCOUNTER — Other Ambulatory Visit: Payer: Self-pay

## 2019-06-17 ENCOUNTER — Ambulatory Visit (HOSPITAL_BASED_OUTPATIENT_CLINIC_OR_DEPARTMENT_OTHER)
Admission: RE | Admit: 2019-06-17 | Discharge: 2019-06-17 | Disposition: A | Discharge status: Home / Self Care | Payer: 59 | Source: Ambulatory Visit | Attending: Student | Admitting: Student

## 2019-06-17 DIAGNOSIS — S82852D Displaced trimalleolar fracture of left lower leg, subsequent encounter for closed fracture with routine healing: Secondary | ICD-10-CM | POA: Diagnosis not present

## 2019-06-17 DIAGNOSIS — S82832D Other fracture of upper and lower end of left fibula, subsequent encounter for closed fracture with routine healing: Secondary | ICD-10-CM | POA: Diagnosis not present

## 2019-06-17 DIAGNOSIS — M25572 Pain in left ankle and joints of left foot: Secondary | ICD-10-CM | POA: Diagnosis not present

## 2019-06-22 ENCOUNTER — Other Ambulatory Visit: Payer: Self-pay | Admitting: Family

## 2019-06-22 DIAGNOSIS — Z1231 Encounter for screening mammogram for malignant neoplasm of breast: Secondary | ICD-10-CM

## 2019-06-24 ENCOUNTER — Encounter: Payer: 59 | Admitting: Family

## 2019-06-24 ENCOUNTER — Other Ambulatory Visit: Payer: Self-pay

## 2019-06-24 ENCOUNTER — Encounter: Payer: Self-pay | Admitting: Family

## 2019-06-24 ENCOUNTER — Ambulatory Visit (INDEPENDENT_AMBULATORY_CARE_PROVIDER_SITE_OTHER): Payer: 59 | Admitting: Family

## 2019-06-24 VITALS — BP 129/66 | HR 69 | Temp 97.0°F | Resp 16 | Ht 68.0 in | Wt 179.0 lb

## 2019-06-24 DIAGNOSIS — I10 Essential (primary) hypertension: Secondary | ICD-10-CM | POA: Diagnosis not present

## 2019-06-24 DIAGNOSIS — M8580 Other specified disorders of bone density and structure, unspecified site: Secondary | ICD-10-CM

## 2019-06-24 DIAGNOSIS — Z0000 Encounter for general adult medical examination without abnormal findings: Secondary | ICD-10-CM

## 2019-06-24 LAB — LIPID PANEL
Cholesterol: 212 mg/dL — ABNORMAL HIGH (ref 0–200)
HDL: 64.7 mg/dL (ref >39.00)
LDL Cholesterol: 130 mg/dL — ABNORMAL HIGH (ref 0–99)
NonHDL: 146.91
Total CHOL/HDL Ratio: 3
Triglycerides: 83 mg/dL (ref 0.0–149.0)
VLDL: 16.6 mg/dL (ref 0.0–40.0)

## 2019-06-24 LAB — HEPATIC FUNCTION PANEL
ALT: 14 U/L (ref 0–35)
AST: 18 U/L (ref 0–37)
Albumin: 4.4 g/dL (ref 3.5–5.2)
Alkaline Phosphatase: 92 U/L (ref 39–117)
Bilirubin, Direct: 0.1 mg/dL (ref 0.0–0.3)
Total Bilirubin: 0.5 mg/dL (ref 0.2–1.2)
Total Protein: 7.2 g/dL (ref 6.0–8.3)

## 2019-06-24 LAB — CBC WITH DIFFERENTIAL/PLATELET
Basophils Absolute: 0 10*3/uL (ref 0.0–0.1)
Basophils Relative: 0.6 % (ref 0.0–3.0)
Eosinophils Absolute: 0.1 10*3/uL (ref 0.0–0.7)
Eosinophils Relative: 1.5 % (ref 0.0–5.0)
HCT: 39.5 % (ref 36.0–46.0)
Hemoglobin: 12.9 g/dL (ref 12.0–15.0)
Lymphocytes Relative: 34.2 % (ref 12.0–46.0)
Lymphs Abs: 1.9 10*3/uL (ref 0.7–4.0)
MCHC: 32.6 g/dL (ref 30.0–36.0)
MCV: 88.9 fl (ref 78.0–100.0)
Monocytes Absolute: 0.4 10*3/uL (ref 0.1–1.0)
Monocytes Relative: 7.5 % (ref 3.0–12.0)
Neutro Abs: 3.1 10*3/uL (ref 1.4–7.7)
Neutrophils Relative %: 56.2 % (ref 43.0–77.0)
Platelets: 312 10*3/uL (ref 150.0–400.0)
RBC: 4.44 Mil/uL (ref 3.87–5.11)
RDW: 13.7 % (ref 11.5–15.5)
WBC: 5.5 10*3/uL (ref 4.0–10.5)

## 2019-06-24 LAB — BASIC METABOLIC PANEL
BUN: 12 mg/dL (ref 6–23)
CO2: 30 mEq/L (ref 19–32)
Calcium: 9.4 mg/dL (ref 8.4–10.5)
Chloride: 104 mEq/L (ref 96–112)
Creatinine, Ser: 0.66 mg/dL (ref 0.40–1.20)
GFR: 91.75 mL/min (ref >60.00)
Glucose, Bld: 89 mg/dL (ref 70–99)
Potassium: 5 mEq/L (ref 3.5–5.1)
Sodium: 139 mEq/L (ref 135–145)

## 2019-06-24 LAB — TSH: TSH: 1.28 u[IU]/mL (ref 0.35–4.50)

## 2019-06-24 NOTE — Patient Instructions (Signed)
Please complete lab work prior to leaving. Continue to work on [employer], exercise and weight loss.    Preventive Care 69-59 Years Old, Female Preventive care refers to visits with your health care provider and lifestyle choices that can promote health and wellness. This includes:  A yearly physical exam. This may also be called an annual well check.  Regular dental visits and eye exams.  Immunizations.  Screening for certain conditions.  Healthy lifestyle choices, such as eating a healthy diet, getting regular exercise, not using drugs or products that contain nicotine and tobacco, and limiting alcohol use. What can I expect for my preventive care visit? Physical exam Your health care provider will check your:  Height and weight. This may be used to calculate body mass index (BMI), which tells if you are at a healthy weight.  Heart rate and blood pressure.  Skin for abnormal spots. Counseling Your health care provider may ask you questions about your:  Alcohol, tobacco, and drug use.  Emotional well-being.  Home and relationship well-being.  Sexual activity.  Eating habits.  Work and work Statistician.  Method of birth control.  Menstrual cycle.  Pregnancy history. What immunizations do I need?  Influenza (flu) vaccine  This is recommended every year. Tetanus, diphtheria, and pertussis (Tdap) vaccine  You may need a Td booster every 10 years. Varicella (chickenpox) vaccine  You may need this if you have not been vaccinated. Zoster (shingles) vaccine  You may need this after age 11. Measles, mumps, and rubella (MMR) vaccine  You may need at least one dose of MMR if you were born in 1957 or later. You may also need a second dose. Pneumococcal conjugate (PCV13) vaccine  You may need this if you have certain conditions and were not previously vaccinated. Pneumococcal polysaccharide (PPSV23) vaccine  You may need one or two doses if you smoke  cigarettes or if you have certain conditions. Meningococcal conjugate (MenACWY) vaccine  You may need this if you have certain conditions. Hepatitis A vaccine  You may need this if you have certain conditions or if you travel or work in places where you may be exposed to hepatitis A. Hepatitis B vaccine  You may need this if you have certain conditions or if you travel or work in places where you may be exposed to hepatitis B. Haemophilus influenzae type b (Hib) vaccine  You may need this if you have certain conditions. Human papillomavirus (HPV) vaccine  If recommended by your health care provider, you may need three doses over 6 months. You may receive vaccines as individual doses or as more than one vaccine together in one shot (combination vaccines). Talk with your health care provider about the risks and benefits of combination vaccines. What tests do I need? Blood tests  Lipid and cholesterol levels. These may be checked every 5 years, or more frequently if you are over 28 years old.  Hepatitis C test.  Hepatitis B test. Screening  Lung cancer screening. You may have this screening every year starting at age 24 if you have a 30-pack-year history of smoking and currently smoke or have quit within the past 15 years.  Colorectal cancer screening. All adults should have this screening starting at age 54 and continuing until age 87. Your health care provider may recommend screening at age 21 if you are at increased risk. You will have tests every 1-10 years, depending on your results and the type of screening test.  Diabetes screening. This is  done by checking your blood sugar (glucose) after you have not eaten for a while (fasting). You may have this done every 1-3 years.  Mammogram. This may be done every 1-2 years. Talk with your health care provider about when you should start having regular mammograms. This may depend on whether you have a family history of breast  cancer.  BRCA-related cancer screening. This may be done if you have a family history of breast, ovarian, tubal, or peritoneal cancers.  Pelvic exam and Pap test. This may be done every 3 years starting at age 21. Starting at age 59, this may be done every 5 years if you have a Pap test in combination with an HPV test. Other tests  Sexually transmitted disease (STD) testing.  Bone density scan. This is done to screen for osteoporosis. You may have this scan if you are at high risk for osteoporosis. Follow these instructions at home: Eating and drinking  Eat a diet that includes fresh fruits and vegetables, whole grains, lean protein, and low-fat dairy.  Take vitamin and mineral supplements as recommended by your health care provider.  Do not drink alcohol if: ? Your health care provider tells you not to drink. ? You are pregnant, may be pregnant, or are planning to become pregnant.  If you drink alcohol: ? Limit how much you have to 0-1 drink a day. ? Be aware of how much alcohol is in your drink. In the [place] one drink equals one 12 oz bottle of beer (355 mL), one 5 oz glass of wine (148 mL), or one 1 oz glass of hard liquor (44 mL). Lifestyle  Take daily care of your teeth and gums.  Stay active. Exercise for at least 30 minutes on 5 or more days each week.  Do not use any products that contain nicotine or tobacco, such as cigarettes, e-cigarettes, and chewing tobacco. If you need help quitting, ask your health care provider.  If you are sexually active, practice safe sex. Use a condom or other form of birth control (contraception) in order to prevent pregnancy and STIs (sexually transmitted infections).  If told by your health care provider, take low-dose aspirin daily starting at age 50. What's next?  Visit your health care provider once a year for a well check visit.  Ask your health care provider how often you should have your eyes and teeth checked.  Stay up to date  on all vaccines. This information is not intended to replace advice given to you by your health care provider. Make sure you discuss any questions you have with your health care provider. Document Revised: 09/12/2017 Document Reviewed: 09/12/2017 Elsevier Patient Education  2020 [employer]  

## 2019-06-24 NOTE — Progress Notes (Signed)
Subjective:    Patient ID: Dominique Rice, female    DOB: 08-Aug-1960, 59 y.o.   MRN: [id number]  HPI  Patient presents today for complete physical.  Immunizations: completed shingrix.  Td 2018 Diet: healthy Exercise: was limited due to ankle fracture. She has a stationary bike and walks.   Wt Readings from Last 3 Encounters:  06/24/19 179 lb (81.2 kg)  12/16/18 170 lb (77.1 kg)  11/13/17 174 lb (78.9 kg)  Colonoscopy: 12/15/12- due 2024 Dexa: 9/18- osteopenia Pap Smear: n/a s/p hysterectomy Mammogram: 07/09/18 Dental: up to date Vision: up to date  HTN- maintained on amlodipine 5mg .   BP Readings from Last 3 Encounters:  06/24/19 129/66  12/16/18 122/64  11/13/17 129/72     Review of Systems  Constitutional: Negative for unexpected weight change.  HENT: Negative for hearing loss and rhinorrhea.   Eyes: Negative for visual disturbance.  Respiratory: Negative for cough and shortness of breath.   Cardiovascular: Negative for chest pain and leg swelling.  Gastrointestinal: Negative for blood in stool, constipation and diarrhea.  Genitourinary: Negative for dysuria, frequency and hematuria.  Musculoskeletal: Negative for arthralgias and myalgias.  Skin: Negative for rash.  Neurological: Negative for headaches.  Hematological: Negative for adenopathy.  Psychiatric/Behavioral:       Denies depression/anxiety     See HPI  Past Medical History:  Diagnosis Date  . Hypertension   . Osteopenia   . Pneumonia      Social History   Socioeconomic History  . Marital status: Married    Spouse name: Not on file  . Number of children: Not on file  . Years of education: Not on file  . Highest education level: Not on file  Occupational History    Employer: [employer]  Tobacco Use  . Smoking status: Never Smoker  . Smokeless tobacco: Never Used  Substance and Sexual Activity  . Alcohol use: Yes    Alcohol/week: 3.0 standard drinks    Types: 3 Glasses of wine  per week  . Drug use: No  . Sexual activity: Not on file  Other Topics Concern  . 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 Difficulty of Paying Living Expenses:   Food Insecurity:   . Worried About Corporate investment banker in the Last Year:   . Programme researcher, broadcasting/film/video in the Last Year:   Transportation Needs:   . Barista (Medical):   Freight forwarder Lack of Transportation (Non-Medical):   Physical Activity:   . Days of Exercise per Week:   . Minutes of Exercise per Session:   Stress:   . Feeling of Stress :   Social Connections:   . Frequency of Communication with Friends and Family:   . Frequency of Social Gatherings with Friends and Family:   . Attends Religious Services:   . Active Member of Clubs or Organizations:   . Attends Marland Kitchen Meetings:   Banker Marital Status:   Intimate Partner Violence:   . Fear of Current or Ex-Partner:   . Emotionally Abused:   Marland Kitchen Physically Abused:   . Sexually Abused:     Past Surgical History:  Procedure Laterality Date  . ABDOMINAL HYSTERECTOMY    .  CESAREAN SECTION     twice  . COLONOSCOPY  2014  . ENDOMETRIAL ABLATION  2005  . OOPHORECTOMY Left 1998  . ORIF ANKLE FRACTURE Left 12/16/2018   Procedure: OPEN REDUCTION INTERNAL FIXATION (ORIF) Left ankle trimalleolar fracture;  Surgeon: Toni Arthurs, MD;  Location: [facility];  Service: Orthopedics;  Laterality: Left;   . TUBAL LIGATION  1998    Family History  Problem Relation Age of Onset  . Cancer Mother 57       colon 2010  . Diabetes Mother        type II  . Colon cancer Mother   . Macular degeneration Mother   . Other Mother        trigeminal neuralgia  . Atrial fibrillation Mother   . Coronary artery disease Father        13 had CABG   . Atrial fibrillation Father   . Hodgkin's lymphoma Son   . Esophageal cancer Neg Hx   . Rectal cancer Neg Hx   . Stomach cancer Neg Hx     Allergies  Allergen Reactions  . Shrimp [Shellfish Allergy] Nausea And Vomiting    09/29/12  Pt states she can tolerate betadine and IV dyes. Tf,cma(aama)    Current Outpatient Medications on File Prior to Visit  Medication Sig Dispense Refill  . amLODipine (NORVASC) 5 MG tablet TAKE 1 TABLET BY MOUTH ONCE DAILY 90 tablet 1  . Calcium-Magnesium-Vitamin D (CALCIUM 500 PO) Take 1 tablet by mouth 2 (two) times daily.    . Cholecalciferol (VITAMIN D3) 2400 UNIT/ML LIQD by Does not apply route.    . clobetasol (TEMOVATE) 0.05 % external solution Apply 1 application topically 2 (two) times daily.    Marland Kitchen ketoconazole (NIZORAL) 2 % shampoo Apply 1 application topically 2 (two) times a week.    . Multiple Vitamin (MULTIVITAMIN) tablet Take 1 tablet by mouth daily.     No current facility-administered medications on file prior to visit.    BP 129/66 (BP Location: Right Arm, Patient Position: Sitting, Cuff Size: Small)   Pulse 69   Temp (!) 97 F (36.1 C) (Temporal)   Resp 16   Ht 5\' 8"  (1.727 m)   Wt 179 lb (81.2 kg)   SpO2 100%   BMI 27.22 kg/m       Objective:   Physical Exam        Assessment & Plan:

## 2019-06-24 NOTE — Progress Notes (Signed)
Subjective:    Patient ID: Dominique Rice, female    DOB: 08-Aug-1960, 59 y.o.   MRN: [id number]  HPI  Patient presents today for complete physical.  Immunizations: completed shingrix.  Td 2018 Diet: healthy Exercise: was limited due to ankle fracture. She has a stationary bike and walks.   Wt Readings from Last 3 Encounters:  06/24/19 179 lb (81.2 kg)  12/16/18 170 lb (77.1 kg)  11/13/17 174 lb (78.9 kg)  Colonoscopy: 12/15/12- due 2024 Dexa: 9/18- osteopenia Pap Smear: n/a s/p hysterectomy Mammogram: 07/09/18 Dental: up to date Vision: up to date  HTN- maintained on amlodipine 5mg .   BP Readings from Last 3 Encounters:  06/24/19 129/66  12/16/18 122/64  11/13/17 129/72     Review of Systems  Constitutional: Negative for unexpected weight change.  HENT: Negative for hearing loss and rhinorrhea.   Eyes: Negative for visual disturbance.  Respiratory: Negative for cough and shortness of breath.   Cardiovascular: Negative for chest pain and leg swelling.  Gastrointestinal: Negative for blood in stool, constipation and diarrhea.  Genitourinary: Negative for dysuria, frequency and hematuria.  Musculoskeletal: Negative for arthralgias and myalgias.  Skin: Negative for rash.  Neurological: Negative for headaches.  Hematological: Negative for adenopathy.  Psychiatric/Behavioral:       Denies depression/anxiety     See HPI  Past Medical History:  Diagnosis Date  . Hypertension   . Osteopenia   . Pneumonia      Social History   Socioeconomic History  . Marital status: Married    Spouse name: Not on file  . Number of children: Not on file  . Years of education: Not on file  . Highest education level: Not on file  Occupational History    Employer: [employer]  Tobacco Use  . Smoking status: Never Smoker  . Smokeless tobacco: Never Used  Substance and Sexual Activity  . Alcohol use: Yes    Alcohol/week: 3.0 standard drinks    Types: 3 Glasses of wine  per week  . Drug use: No  . Sexual activity: Not on file  Other Topics Concern  . 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 Difficulty of Paying Living Expenses:   Food Insecurity:   . Worried About Corporate investment banker in the Last Year:   . Programme researcher, broadcasting/film/video in the Last Year:   Transportation Needs:   . Barista (Medical):   Freight forwarder Lack of Transportation (Non-Medical):   Physical Activity:   . Days of Exercise per Week:   . Minutes of Exercise per Session:   Stress:   . Feeling of Stress :   Social Connections:   . Frequency of Communication with Friends and Family:   . Frequency of Social Gatherings with Friends and Family:   . Attends Religious Services:   . Active Member of Clubs or Organizations:   . Attends Marland Kitchen Meetings:   Banker Marital Status:   Intimate Partner Violence:   . Fear of Current or Ex-Partner:   . Emotionally Abused:   Marland Kitchen Physically Abused:   . Sexually Abused:     Past Surgical History:  Procedure Laterality Date  . ABDOMINAL HYSTERECTOMY    .  CESAREAN SECTION     twice  . COLONOSCOPY  2014  . ENDOMETRIAL ABLATION  2005  . OOPHORECTOMY Left 1998  . ORIF ANKLE FRACTURE Left 12/16/2018   Procedure: OPEN REDUCTION INTERNAL FIXATION (ORIF) Left ankle trimalleolar fracture;  Surgeon: Toni Arthurs, MD;  [facility];  Service: Orthopedics;  Laterality: Left;   . TUBAL LIGATION  1998    Family History  Problem Relation Age of Onset  . Cancer Mother 33       colon 2010  . Diabetes Mother        type II  . Colon cancer Mother   . Macular degeneration Mother   . Other Mother        trigeminal neuralgia  . Atrial fibrillation Mother   . Coronary artery disease Father        34 had CABG   . Atrial fibrillation Father   . Hodgkin's lymphoma Son   . Esophageal cancer Neg Hx   . Rectal cancer Neg Hx   . Stomach cancer Neg Hx     Allergies  Allergen Reactions  . Shrimp [Shellfish Allergy] Nausea And Vomiting    09/29/12  Pt states she can tolerate betadine and IV dyes. Tf,cma(aama)    Current Outpatient Medications on File Prior to Visit  Medication Sig Dispense Refill  . amLODipine (NORVASC) 5 MG tablet TAKE 1 TABLET BY MOUTH ONCE DAILY 90 tablet 1  . Calcium-Magnesium-Vitamin D (CALCIUM 500 PO) Take 1 tablet by mouth 2 (two) times daily.    . Cholecalciferol (VITAMIN D3) 2400 UNIT/ML LIQD by Does not apply route.    . clobetasol (TEMOVATE) 0.05 % external solution Apply 1 application topically 2 (two) times daily.    Marland Kitchen ketoconazole (NIZORAL) 2 % shampoo Apply 1 application topically 2 (two) times a week.    . Multiple Vitamin (MULTIVITAMIN) tablet Take 1 tablet by mouth daily.     No current facility-administered medications on file prior to visit.    BP 129/66 (BP Location: Right Arm, Patient Position: Sitting, Cuff Size: Small)   Pulse 69   Temp (!) 97 F (36.1 C) (Temporal)   Resp 16   Ht 5\' 8"  (1.727 m)   Wt 179 lb (81.2 kg)   SpO2 100%   BMI 27.22 kg/m       Objective:   Physical Exam  Physical Exam  Constitutional: She is oriented to person, place, and time. She appears well-developed and well-nourished. No distress.  HENT:  Head: Normocephalic and atraumatic.  Right Ear: Tympanic membrane and ear canal normal.  Left Ear: Tympanic membrane and ear canal normal.  Mouth/Throat: Not examined- pt wearing mask Eyes: Pupils are equal, round, and reactive to light. No scleral icterus.  Neck: Normal range of motion. No thyromegaly present.  Cardiovascular: Normal rate and regular rhythm.   No murmur heard. Pulmonary/Chest: Effort normal and breath sounds normal. No respiratory distress. He has no wheezes. She has no rales. She exhibits no tenderness.    Abdominal: Soft. Bowel sounds are normal. She exhibits no distension and no mass. There is no tenderness. There is no rebound and no guarding.  Musculoskeletal: She exhibits no edema.  Lymphadenopathy:    She has no cervical adenopathy.  Neurological: She is alert and oriented to person, place, and time. She has normal patellar reflexes. She exhibits normal muscle tone. Coordination normal.  Skin: Skin is warm and dry.  Psychiatric: She has a normal mood and affect. Her behavior  is normal. Judgment and thought content normal.     Breast/pelvic: deferred  Assessment & Plan:   Preventative care- encouraged pt to continue her work with healthy diet, exercise and weight loss.  She will schedule a mammogram later this month. Colo and immunizations reviewed and up to date.    HTN- bp stable, continue amlodipine.  Osteopenia- check vit D level, continue caltrate, weight bearing exercise. Obtain follow up bone density (especially important given recent fracture).  This visit occurred during the SARS-CoV-2 public health emergency.  Safety protocols were in place, including screening questions prior to the visit, additional usage of staff PPE, and extensive cleaning of exam room while observing appropriate contact time as indicated for disinfecting solutions.         Assessment & Plan:

## 2019-06-27 LAB — VITAMIN D 1,25 DIHYDROXY
Vitamin D 1, 25 (OH)2 Total: 52 pg/mL (ref 18–72)
Vitamin D2 1, 25 (OH)2: <8 pg/mL
Vitamin D3 1, 25 (OH)2: 52 pg/mL

## 2019-06-29 ENCOUNTER — Other Ambulatory Visit: Payer: Self-pay | Admitting: Family

## 2019-06-29 ENCOUNTER — Ambulatory Visit (HOSPITAL_BASED_OUTPATIENT_CLINIC_OR_DEPARTMENT_OTHER)
Admission: RE | Admit: 2019-06-29 | Discharge: 2019-06-29 | Disposition: A | Discharge status: Home / Self Care | Payer: 59 | Source: Ambulatory Visit | Attending: Family | Admitting: Family

## 2019-06-29 ENCOUNTER — Other Ambulatory Visit: Payer: Self-pay

## 2019-06-29 DIAGNOSIS — Z780 Asymptomatic menopausal state: Secondary | ICD-10-CM | POA: Diagnosis not present

## 2019-06-29 DIAGNOSIS — M8580 Other specified disorders of bone density and structure, unspecified site: Secondary | ICD-10-CM | POA: Insufficient documentation

## 2019-06-29 DIAGNOSIS — M85852 Other specified disorders of bone density and structure, left thigh: Secondary | ICD-10-CM | POA: Diagnosis not present

## 2019-06-30 MED FILL — AMLODIPINE BESYLATE 5 MG TA: 5 | 90 days supply | Qty: 90 | Fill #0

## 2019-07-13 ENCOUNTER — Ambulatory Visit: Payer: 59

## 2019-07-15 ENCOUNTER — Ambulatory Visit
Admission: RE | Admit: 2019-07-15 | Discharge: 2019-07-15 | Disposition: A | Discharge status: Home / Self Care | Payer: 59 | Source: Ambulatory Visit | Attending: Family | Admitting: Family

## 2019-07-15 ENCOUNTER — Other Ambulatory Visit: Payer: Self-pay

## 2019-07-15 DIAGNOSIS — Z1231 Encounter for screening mammogram for malignant neoplasm of breast: Secondary | ICD-10-CM | POA: Diagnosis not present

## 2019-07-15 DIAGNOSIS — M25572 Pain in left ankle and joints of left foot: Secondary | ICD-10-CM | POA: Diagnosis not present

## 2019-07-15 DIAGNOSIS — T8484XA Pain due to internal orthopedic prosthetic devices, implants and grafts, initial encounter: Secondary | ICD-10-CM | POA: Diagnosis not present

## 2019-07-15 MED FILL — CEPHALEXIN 500 MG CAPSULE: 500 | 1 days supply | Qty: 4 | Fill #0

## 2019-08-12 DIAGNOSIS — T8484XA Pain due to internal orthopedic prosthetic devices, implants and grafts, initial encounter: Secondary | ICD-10-CM | POA: Diagnosis not present

## 2019-09-28 MED FILL — AMLODIPINE BESYLATE 5 MG TA: 5 | 90 days supply | Qty: 90 | Fill #1

## 2019-12-23 ENCOUNTER — Other Ambulatory Visit: Payer: Self-pay | Admitting: Family

## 2019-12-23 ENCOUNTER — Telehealth (INDEPENDENT_AMBULATORY_CARE_PROVIDER_SITE_OTHER): Payer: 59 | Admitting: Family

## 2019-12-23 ENCOUNTER — Other Ambulatory Visit: Payer: Self-pay

## 2019-12-23 VITALS — BP 107/76 | HR 76

## 2019-12-23 DIAGNOSIS — M8580 Other specified disorders of bone density and structure, unspecified site: Secondary | ICD-10-CM | POA: Diagnosis not present

## 2019-12-23 DIAGNOSIS — I10 Essential (primary) hypertension: Secondary | ICD-10-CM

## 2019-12-23 MED ORDER — AMLODIPINE BESYLATE 5 MG PO TABS: 5.0000 mg | ORAL_TABLET | Freq: Every day | ORAL | 1 refills | Status: DC

## 2019-12-23 MED FILL — AMLODIPINE BESYLATE 5 MG TA: 5 | 90 days supply | Qty: 90 | Fill #0

## 2019-12-23 NOTE — Progress Notes (Signed)
Virtual Visit via Video Note  I connected with Dominique Rice on 12/23/19 at  8:20 AM EST by a video enabled telemedicine application and verified that I am speaking with the correct person using two identifiers.  Location: Patient: home Provider: work   I discussed the limitations of evaluation and management by telemedicine and the availability of in person appointments. The patient expressed understanding and agreed to proceed. Only the patient and myself were present for today's video call.   History of Present Illness:  Patient is a 59 yr old female who presents today for routine follow up.  HTN- maintained on amlodipine 5mg . Denies LE edema.   BP Readings from Last 3 Encounters:  12/23/19 107/76  06/24/19 129/66  12/16/18 122/64   Osteopenia- maintained on caltrate + D.  Last bone density was 06/29/19    Observations/Objective:   Gen: Awake, alert, no acute distress Resp: Breathing is even and non-labored Psych: calm/pleasant demeanor Neuro: Alert and Oriented x 3, + facial symmetry, speech is clear.   Assessment and Plan:  HTN- bp looks great on amlodipine 5mg .  Will continue same.  Osteopenia- continue calrate +D and regular exercise.  She is up to date on her covid booster and flu shot.     Follow Up Instructions:    I discussed the assessment and treatment plan with the patient. The patient was provided an opportunity to ask questions and all were answered. The patient agreed with the plan and demonstrated an understanding of the instructions.   The patient was advised to call back or seek an in-person evaluation if the symptoms worsen or if the condition fails to improve as anticipated.  07/01/19, NP

## 2020-04-27 ENCOUNTER — Ambulatory Visit: Payer: 59 | Attending: Internal Medicine

## 2020-04-27 DIAGNOSIS — Z23 Encounter for immunization: Secondary | ICD-10-CM

## 2020-04-27 NOTE — Progress Notes (Signed)
   Covid-19 Vaccination Clinic  Name:  Dominique Rice    MRN: [id number] DOB: [birth year]  04/27/2020  [name] was observed post Covid-19 immunization for 15 minutes without incident. She was provided with Vaccine Information Sheet and instruction to access the V-Safe system.   [name] was instructed to call 911 with any severe reactions post vaccine: Marland Kitchen Difficulty breathing  . Swelling of face and throat  . A fast heartbeat  . A bad rash all over body  . Dizziness and weakness   Immunizations Administered    Name Date Dose VIS Date Route   PFIZER Comrnaty(Gray TOP) Covid-19 Vaccine 04/27/2020 10:30 AM 0.3 mL 12/24/2019 Intramuscular   Manufacturer: [employer], [employer]   Lot: [id number]   NDC: [phone]

## 2020-05-02 ENCOUNTER — Other Ambulatory Visit (HOSPITAL_BASED_OUTPATIENT_CLINIC_OR_DEPARTMENT_OTHER): Payer: Self-pay

## 2020-05-02 MED ORDER — PFIZER-BIONT COVID-19 VAC-TRIS 30 MCG/0.3ML IM SUSP
INTRAMUSCULAR | 0 refills | Status: DC
  Filled 2020-05-02: qty 0.3, 1d supply, fill #0

## 2020-06-24 ENCOUNTER — Ambulatory Visit (INDEPENDENT_AMBULATORY_CARE_PROVIDER_SITE_OTHER): Payer: 59 | Admitting: Family

## 2020-06-24 ENCOUNTER — Encounter: Payer: Self-pay | Admitting: Family

## 2020-06-24 ENCOUNTER — Other Ambulatory Visit: Payer: Self-pay

## 2020-06-24 ENCOUNTER — Other Ambulatory Visit (HOSPITAL_BASED_OUTPATIENT_CLINIC_OR_DEPARTMENT_OTHER): Payer: Self-pay

## 2020-06-24 VITALS — BP 132/70 | HR 67 | Temp 98.1°F | Resp 16 | Ht 68.0 in | Wt 175.0 lb

## 2020-06-24 DIAGNOSIS — Z0000 Encounter for general adult medical examination without abnormal findings: Secondary | ICD-10-CM | POA: Diagnosis not present

## 2020-06-24 DIAGNOSIS — I10 Essential (primary) hypertension: Secondary | ICD-10-CM | POA: Diagnosis not present

## 2020-06-24 DIAGNOSIS — E785 Hyperlipidemia, unspecified: Secondary | ICD-10-CM

## 2020-06-24 LAB — LIPID PANEL
Cholesterol: 203 mg/dL — ABNORMAL HIGH (ref 0–200)
HDL: 61.9 mg/dL (ref >39.00)
LDL Cholesterol: 126 mg/dL — ABNORMAL HIGH (ref 0–99)
NonHDL: 141.28
Total CHOL/HDL Ratio: 3
Triglycerides: 75 mg/dL (ref 0.0–149.0)
VLDL: 15 mg/dL (ref 0.0–40.0)

## 2020-06-24 LAB — COMPREHENSIVE METABOLIC PANEL
ALT: 11 U/L (ref 0–35)
AST: 14 U/L (ref 0–37)
Albumin: 4.5 g/dL (ref 3.5–5.2)
Alkaline Phosphatase: 84 U/L (ref 39–117)
BUN: 15 mg/dL (ref 6–23)
CO2: 30 mEq/L (ref 19–32)
Calcium: 9.6 mg/dL (ref 8.4–10.5)
Chloride: 104 mEq/L (ref 96–112)
Creatinine, Ser: 0.72 mg/dL (ref 0.40–1.20)
GFR: 91.31 mL/min (ref >60.00)
Glucose, Bld: 94 mg/dL (ref 70–99)
Potassium: 5.1 mEq/L (ref 3.5–5.1)
Sodium: 140 mEq/L (ref 135–145)
Total Bilirubin: 0.6 mg/dL (ref 0.2–1.2)
Total Protein: 7.3 g/dL (ref 6.0–8.3)

## 2020-06-24 MED ORDER — AMLODIPINE BESYLATE 5 MG PO TABS
ORAL_TABLET | Freq: Every day | ORAL | 1 refills | Status: DC
  Filled 2020-06-24: qty 90, 90d supply, fill #0
  Filled 2020-09-20: qty 90, 90d supply, fill #1

## 2020-06-24 NOTE — Progress Notes (Signed)
Subjective:   By signing my name below, I, Shehryar Baig, attest that this documentation has been prepared under the direction and in the presence of Sandford Craze NP. 06/24/2020    Patient ID: Dominique Rice, female    DOB: 1960/12/01, 60 y.o.   MRN: [id number]  No chief complaint on file.   HPI Patient is in today for a comprehensive physical exam. She denies having any unexpected weight change, hearing loss and rhinorrhea, visual disturbance, cough, chest pain and leg swelling, nausea, vomiting, diarrhea and blood in stool, or dysuria and frequency, for myalgias and arthralgias, rash, headaches, adenopathy, depression or anxiety at this time. She has no changes in her family medical history. She has no changes in her surgical history. She drinks occasional wine, does not use drugs, is currently sexually active, does not smoke. She currently works at [employer].  Immunizations: She has 4 Development worker, international aid vaccines. She is UTD on shingles vaccine. She is UTD on tetanus vaccines. She is not interested in getting HIV and hepatitis C screening. Diet: She manages a healthy diet. Exercise: She participates in exercise by walking and using a stationary bike. Colonoscopy: Last completed 12/15/2012. Results showed mild diverticulosis in the sigmoid colon, otherwise results are normal. Repeat in 10 years.  Dexa: Last completed 06/29/2019. Results showed osteopenia. Repeat in 1-2 years.  Mammogram: Last completed 07/15/2019. Results normal. Repeat in 1 year.  Dental: She is UTD on dental exams. Vision: She is due for a vision exam.   Health Maintenance Due  Topic Date Due   HIV Screening  Never done   Hepatitis C Screening  Never done    Past Medical History:  Diagnosis Date   Hypertension    Osteopenia    Pneumonia     Past Surgical History:  Procedure Laterality Date   ABDOMINAL HYSTERECTOMY     CESAREAN SECTION     twice   COLONOSCOPY  2014   ENDOMETRIAL ABLATION   2005   OOPHORECTOMY Left 1998   ORIF ANKLE FRACTURE Left 12/16/2018   Procedure: OPEN REDUCTION INTERNAL FIXATION (ORIF) Left ankle trimalleolar fracture;  Surgeon: Toni Arthurs, MD;  Location: [facility];  Service: Orthopedics;  Laterality: Left;    TUBAL LIGATION  1998    Family History  Problem Relation Age of Onset   Cancer Mother 69       colon 2010   Diabetes Mother        type II   Colon cancer Mother    Macular degeneration Mother    Other Mother        trigeminal neuralgia   Atrial fibrillation Mother    Coronary artery disease Father        70 had CABG   Atrial fibrillation Father    Hodgkin's lymphoma Son    Esophageal cancer Neg Hx    Rectal cancer Neg Hx    Stomach cancer Neg Hx     Social History   Socioeconomic History   Marital status: Married    Spouse name: Not on file   Number of children: Not on file   Years of education: Not on file   Highest education level: Not on file  Occupational History    Employer: [employer]  Tobacco Use   Smoking status: Never   Smokeless tobacco: Never  Vaping Use   Vaping Use: Never used  Substance and Sexual Activity   Alcohol use: Yes    Alcohol/week: 3.0 standard drinks  Types: 3 Glasses of wine per week   Drug use: No   Sexual activity: Not on file  Other Topics Concern   Not on file  Social History Narrative   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 90  tablet 1   Calcium-Magnesium-Vitamin D (CALCIUM 500 PO) Take 1 tablet by mouth 2 (two) times daily.     Cholecalciferol (VITAMIN D3) 2400 UNIT/ML LIQD by Does not apply route.     clobetasol (TEMOVATE) 0.05 % external solution Apply 1 application topically 2 (two) times daily.     COVID-19 mRNA Vac-TriS, Pfizer, (PFIZER-BIONT COVID-19 VAC-TRIS) SUSP injection Inject into the muscle. 0.3 mL 0   ketoconazole (NIZORAL) 2 % shampoo Apply 1 application topically 2 (two) times a week.     Multiple Vitamin (MULTIVITAMIN) tablet Take 1 tablet by mouth daily.     No facility-administered medications prior to visit.    Allergies  Allergen Reactions   Shrimp [Shellfish Allergy] Nausea And Vomiting    09/29/12  Pt states she can tolerate betadine and IV dyes. Tf,cma(aama)    Review of Systems  Constitutional:        (-)Weight changes (-)adenopathy    HENT:  Negative for ear discharge and hearing loss.   Eyes:        (-)Visual disturbance  Respiratory:  Negative for cough.   Cardiovascular:  Negative for chest pain and leg swelling.  Gastrointestinal:  Negative for diarrhea, nausea and vomiting.  Genitourinary:  Negative for dysuria and frequency.  Musculoskeletal:  Negative for myalgias.       (-)arthralgias  Skin:  Negative for rash.  Neurological:  Negative for headaches.  Psychiatric/Behavioral:  Negative for depression. The patient is not nervous/anxious.       Objective:    Physical Exam Exam conducted with a chaperone present.  Constitutional:      General: She is not in acute distress.    Appearance: Normal appearance. She is not ill-appearing.  HENT:     Head: Normocephalic and atraumatic.     Right Ear: External ear normal.     Left Ear: External ear normal.  Eyes:     Extraocular Movements: Extraocular movements intact.     Pupils: Pupils are equal, round, and reactive to light.     Comments: No nystagmus  Cardiovascular:     Rate and Rhythm: Normal rate and regular  rhythm.     Pulses: Normal pulses.     Heart sounds: Normal heart sounds. No murmur heard.   No gallop.  Pulmonary:     Effort: Pulmonary effort is normal. No respiratory distress.     Breath sounds: Normal breath sounds. No wheezing, rhonchi or rales.  Chest:  Breasts:    Right: Normal. No mass or axillary adenopathy.     Left: Normal. No mass or axillary adenopathy.  Abdominal:  General: Bowel sounds are normal. There is no distension.     Palpations: There is no mass.     Tenderness: There is no abdominal tenderness. There is no guarding or rebound.     Hernia: No hernia is present.  Musculoskeletal:     Comments: 5/5 strength in both upper and lower extremities  Lymphadenopathy:     Cervical: No cervical adenopathy.     Upper Body:     Right upper body: No axillary adenopathy.     Left upper body: No axillary adenopathy.  Skin:    General: Skin is warm and dry.  Neurological:     Mental Status: She is alert and oriented to person, place, and time.     Deep Tendon Reflexes:     Reflex Scores:      Patellar reflexes are 2+ on the right side and 2+ on the left side. Psychiatric:        Behavior: Behavior normal.    There were no vitals taken for this visit. Wt Readings from Last 3 Encounters:  06/24/19 179 lb (81.2 kg)  12/16/18 170 lb (77.1 kg)  11/13/17 174 lb (78.9 kg)       Assessment & Plan:   Problem List Items Addressed This Visit   None    No orders of the defined types were placed in this encounter.   I, Sandford Craze NP, personally preformed the services described in this documentation.  All medical record entries made by the scribe were at my direction and in my presence.  I have reviewed the chart and discharge instructions (if applicable) and agree that the record reflects my personal performance and is accurate and complete. 06/24/2020   I,Shehryar Baig,acting as a Neurosurgeon for Lemont Fillers, NP.,have documented all relevant  documentation on the behalf of Lemont Fillers, NP,as directed by  Lemont Fillers, NP while in the presence of Lemont Fillers, NP.   Shehryar [employer]

## 2020-06-24 NOTE — Assessment & Plan Note (Signed)
Encouraged pt to continue healthy diet and regular exercise.  Mammogram ordered. Colo up to date.  Immunizations reviewed and up to date.

## 2020-06-24 NOTE — Assessment & Plan Note (Signed)
BP is stable on amlodipine 5mg  once daily. Continue same.

## 2020-09-20 ENCOUNTER — Other Ambulatory Visit (HOSPITAL_BASED_OUTPATIENT_CLINIC_OR_DEPARTMENT_OTHER): Payer: Self-pay

## 2020-10-04 ENCOUNTER — Other Ambulatory Visit: Payer: Self-pay

## 2020-10-04 ENCOUNTER — Ambulatory Visit
Admission: RE | Admit: 2020-10-04 | Discharge: 2020-10-04 | Disposition: A | Discharge status: Home / Self Care | Payer: 59 | Source: Ambulatory Visit | Attending: Family | Admitting: Family

## 2020-10-04 DIAGNOSIS — Z0000 Encounter for general adult medical examination without abnormal findings: Secondary | ICD-10-CM

## 2020-10-04 DIAGNOSIS — Z1231 Encounter for screening mammogram for malignant neoplasm of breast: Secondary | ICD-10-CM | POA: Diagnosis not present

## 2020-10-19 ENCOUNTER — Ambulatory Visit: Payer: 59 | Attending: Internal Medicine

## 2020-10-19 DIAGNOSIS — Z23 Encounter for immunization: Secondary | ICD-10-CM

## 2020-10-19 NOTE — Progress Notes (Signed)
   Covid-19 Vaccination Clinic  Name:  Dominique Rice    MRN: [id number] DOB: 09-May-1960  10/19/2020  Dominique Rice was observed post Covid-19 immunization for 15 minutes without incident. She was provided with Vaccine Information Sheet and instruction to access the V-Safe system.   Dominique Rice was instructed to call 911 with any severe reactions post vaccine: Difficulty breathing  Swelling of face and throat  A fast heartbeat  A bad rash all over body  Dizziness and weakness

## 2020-10-28 ENCOUNTER — Other Ambulatory Visit (HOSPITAL_BASED_OUTPATIENT_CLINIC_OR_DEPARTMENT_OTHER): Payer: Self-pay

## 2020-10-28 MED ORDER — COVID-19MRNA BIVAL VACC PFIZER 30 MCG/0.3ML IM SUSP
INTRAMUSCULAR | 0 refills | Status: DC
  Filled 2020-10-28: qty 0.3, 1d supply, fill #0

## 2020-12-28 ENCOUNTER — Other Ambulatory Visit (HOSPITAL_BASED_OUTPATIENT_CLINIC_OR_DEPARTMENT_OTHER): Payer: Self-pay

## 2020-12-28 ENCOUNTER — Ambulatory Visit: Payer: 59 | Admitting: Family

## 2020-12-28 DIAGNOSIS — I10 Essential (primary) hypertension: Secondary | ICD-10-CM

## 2020-12-28 MED ORDER — AMLODIPINE BESYLATE 5 MG PO TABS
ORAL_TABLET | Freq: Every day | ORAL | 1 refills | Status: DC
  Filled 2020-12-28: qty 90, 90d supply, fill #0
  Filled 2021-03-27: qty 90, 90d supply, fill #1

## 2020-12-28 NOTE — Progress Notes (Signed)
Subjective:     Patient ID: Dominique Rice, female    DOB: March 17, 1960, 60 y.o.   MRN: [id number]  Chief Complaint  Patient presents with   Follow-up    6 month     HPI Patient is in today for follow up.  HTN- Patient continues amlodipine 5mg  once daily. Denies side effects or other concerns today.  BP Readings from Last 3 Encounters:  12/28/20 (!) 125/51  06/24/20 132/70  12/23/19 107/76     Health Maintenance Due  Topic Date Due   Pneumococcal Vaccine 13-38 Years old (1 - PCV) Never done    Past Medical History:  Diagnosis Date   Hypertension    Osteopenia    Pneumonia     Past Surgical History:  Procedure Laterality Date   ABDOMINAL HYSTERECTOMY     CESAREAN SECTION     twice   COLONOSCOPY  2014   ENDOMETRIAL ABLATION  2005   OOPHORECTOMY Left 1998   ORIF ANKLE FRACTURE Left 12/16/2018   Procedure: OPEN REDUCTION INTERNAL FIXATION (ORIF) Left ankle trimalleolar fracture;  Surgeon: 14/01/2018, MD;  Location:  [facility];  Service: Orthopedics;  Laterality: Left;  Toni Arthurs   TUBAL LIGATION  1998    Family History  Problem Relation Age of Onset   Cancer Mother 44       colon 2010   Diabetes Mother        type II   Colon cancer Mother    Macular degeneration Mother    Other Mother        trigeminal neuralgia   Atrial fibrillation Mother    Coronary artery disease Father        95 had CABG   Atrial fibrillation Father    Hodgkin's lymphoma Son    Esophageal cancer Neg Hx    Rectal cancer Neg Hx    Stomach cancer Neg Hx     Social History   Socioeconomic History   Marital status: Married    Spouse name: Not on file   Number of children: Not on file   Years of education: Not on file   Highest education level: Not on file  Occupational History    Employer: [employer]  Tobacco Use   Smoking status: Never   Smokeless tobacco: Never  Vaping Use   Vaping Use: Never used  Substance and Sexual Activity   Alcohol use: Yes     Alcohol/week: 3.0 standard drinks    Types: 3 Glasses of wine per week   Drug use: No   Sexual activity: Yes  Other Topics Concern   Not on file  Social History Narrative   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 90 tablet 1   Calcium-Magnesium-Vitamin D (CALCIUM 500 PO) Take 1 tablet by mouth 2 (two) times daily.     Cholecalciferol (VITAMIN D3) 2400 UNIT/ML LIQD  by Does not apply route.     clobetasol (TEMOVATE) 0.05 % external solution Apply 1 application topically 2 (two) times daily.     COVID-19 mRNA bivalent vaccine, Pfizer, injection Inject into the muscle. 0.3 mL 0   COVID-19 mRNA Vac-TriS, Pfizer, (PFIZER-BIONT COVID-19 VAC-TRIS) SUSP injection Inject into the muscle. 0.3 mL 0   ketoconazole (NIZORAL) 2 % shampoo Apply 1 application topically 2 (two) times a week.     Multiple Vitamin (MULTIVITAMIN) tablet Take 1 tablet by mouth daily.     No facility-administered medications prior to visit.    Allergies  Allergen Reactions   Shrimp [Shellfish Allergy] Nausea And Vomiting    09/29/12  Pt states she can tolerate betadine and IV dyes. Tf,cma(aama)    ROS See HPI    Objective:    Physical Exam Constitutional:      General: She is not in acute distress.    Appearance: Normal appearance. She is well-developed.  HENT:     Head: Normocephalic and atraumatic.     Right Ear: External ear normal.     Left Ear: External ear normal.  Eyes:      General: No scleral icterus. Neck:     Thyroid: No thyromegaly.  Cardiovascular:     Rate and Rhythm: Normal rate and regular rhythm.     Heart sounds: Normal heart sounds. No murmur heard. Pulmonary:     Effort: Pulmonary effort is normal. No respiratory distress.     Breath sounds: Normal breath sounds. No wheezing.  Musculoskeletal:     Cervical back: Neck supple.  Skin:    General: Skin is warm and dry.  Neurological:     Mental Status: She is alert and oriented to person, place, and time.  Psychiatric:        Mood and Affect: Mood normal.        Behavior: Behavior normal.        Thought Content: Thought content normal.        Judgment: Judgment normal.    BP (!) 125/51    Pulse 66    Temp 98 F (36.7 C)    Resp 18    Ht 5\' 8"  (1.727 m)    Wt 181 lb (82.1 kg)    SpO2 100%    BMI 27.52 kg/m  Wt Readings from Last 3 Encounters:  12/28/20 181 lb (82.1 kg)  06/24/20 175 lb (79.4 kg)  06/24/19 179 lb (81.2 kg)       Assessment & Plan:   Problem List Items Addressed This Visit   None   I am having 08/24/19 maintain her multivitamin, Calcium-Magnesium-Vitamin D (CALCIUM 500 PO), clobetasol, ketoconazole, Vitamin D3, Pfizer-BioNT COVID-19 Vac-TriS, amLODipine, and COVID-19 mRNA bivalent vaccine [facility]).  No orders of the defined types were placed in this encounter.

## 2020-12-28 NOTE — Assessment & Plan Note (Signed)
BP stable. Continue current dose of amlodipine.

## 2021-03-27 ENCOUNTER — Other Ambulatory Visit (HOSPITAL_BASED_OUTPATIENT_CLINIC_OR_DEPARTMENT_OTHER): Payer: Self-pay

## 2021-03-27 DIAGNOSIS — H524 Presbyopia: Secondary | ICD-10-CM | POA: Diagnosis not present

## 2021-03-27 DIAGNOSIS — Z135 Encounter for screening for eye and ear disorders: Secondary | ICD-10-CM | POA: Diagnosis not present

## 2021-05-05 ENCOUNTER — Other Ambulatory Visit: Payer: Self-pay

## 2021-06-28 ENCOUNTER — Encounter: Payer: Self-pay | Admitting: Family

## 2021-06-28 ENCOUNTER — Ambulatory Visit (INDEPENDENT_AMBULATORY_CARE_PROVIDER_SITE_OTHER): Payer: 59 | Admitting: Family

## 2021-06-28 ENCOUNTER — Other Ambulatory Visit (HOSPITAL_BASED_OUTPATIENT_CLINIC_OR_DEPARTMENT_OTHER): Payer: Self-pay

## 2021-06-28 VITALS — BP 137/50 | HR 64 | Temp 98.3°F | Resp 18 | Ht 68.0 in | Wt 184.8 lb

## 2021-06-28 DIAGNOSIS — E785 Hyperlipidemia, unspecified: Secondary | ICD-10-CM | POA: Diagnosis not present

## 2021-06-28 DIAGNOSIS — Z0000 Encounter for general adult medical examination without abnormal findings: Secondary | ICD-10-CM

## 2021-06-28 DIAGNOSIS — I10 Essential (primary) hypertension: Secondary | ICD-10-CM | POA: Diagnosis not present

## 2021-06-28 DIAGNOSIS — Z1231 Encounter for screening mammogram for malignant neoplasm of breast: Secondary | ICD-10-CM | POA: Diagnosis not present

## 2021-06-28 LAB — LIPID PANEL
Cholesterol: 216 mg/dL — ABNORMAL HIGH (ref 0–200)
HDL: 74 mg/dL (ref >39.00)
LDL Cholesterol: 124 mg/dL — ABNORMAL HIGH (ref 0–99)
NonHDL: 141.64
Total CHOL/HDL Ratio: 3
Triglycerides: 89 mg/dL (ref 0.0–149.0)
VLDL: 17.8 mg/dL (ref 0.0–40.0)

## 2021-06-28 LAB — COMPREHENSIVE METABOLIC PANEL
ALT: 20 U/L (ref 0–35)
AST: 21 U/L (ref 0–37)
Albumin: 4.2 g/dL (ref 3.5–5.2)
Alkaline Phosphatase: 79 U/L (ref 39–117)
BUN: 17 mg/dL (ref 6–23)
CO2: 28 mEq/L (ref 19–32)
Calcium: 9.7 mg/dL (ref 8.4–10.5)
Chloride: 103 mEq/L (ref 96–112)
Creatinine, Ser: 0.69 mg/dL (ref 0.40–1.20)
GFR: 94.11 mL/min (ref >60.00)
Glucose, Bld: 96 mg/dL (ref 70–99)
Potassium: 4.8 mEq/L (ref 3.5–5.1)
Sodium: 139 mEq/L (ref 135–145)
Total Bilirubin: 0.5 mg/dL (ref 0.2–1.2)
Total Protein: 6.9 g/dL (ref 6.0–8.3)

## 2021-06-28 MED ORDER — AMLODIPINE BESYLATE 5 MG PO TABS
ORAL_TABLET | Freq: Every day | ORAL | 1 refills | Status: DC
  Filled 2021-06-28: qty 90, 90d supply, fill #0
  Filled 2021-09-27: qty 90, 90d supply, fill #1

## 2021-06-28 NOTE — Assessment & Plan Note (Signed)
Discussed healthy diet, exercise, weight loss.  Mammo due this fall, colo 2024.  Immunizations reviewed and up to date.

## 2021-06-28 NOTE — Assessment & Plan Note (Signed)
BP stable on amlodipine 5mg  once daily. Continue same.

## 2021-06-28 NOTE — Progress Notes (Signed)
Subjective:     Patient ID: Dominique Rice, female    DOB: [birth year], 61 y.o.   MRN: [id number]  Chief Complaint  Patient presents with   Annual Exam    HPI Patient is in today for CPX.  Patient presents today for complete physical.  Immunizations: up to date Diet: fair, has a lot of family stress Wt Readings from Last 3 Encounters:  06/28/21 184 lb 12.8 oz (83.8 kg)  12/28/20 181 lb (82.1 kg)  06/24/20 175 lb (79.4 kg)  Exercise: Walking Colonoscopy:  due 2024 Pap Smear: hysterectomy Mammogram: due in September Vision: up to date Dental: up to date   HTN- on amlodipine 4m.  BP Readings from Last 3 Encounters:  06/28/21 (!) 137/50  12/28/20 (!) 125/51  06/24/20 132/70    There are no preventive care reminders to display for this patient.  Past Medical History:  Diagnosis Date   Hypertension    Osteopenia    Pneumonia     Past Surgical History:  Procedure Laterality Date   ABDOMINAL HYSTERECTOMY     CESAREAN SECTION     twice   COLONOSCOPY  2014   ENDOMETRIAL ABLATION  2005   OOPHORECTOMY Left 1998   ORIF ANKLE FRACTURE Left 12/16/2018   Procedure: OPEN REDUCTION INTERNAL FIXATION (ORIF) Left ankle trimalleolar fracture;  Surgeon: HWylene Simmer MD;  Location: [facility]  Service: Orthopedics;  Laterality: Left;  945m   TUBAL LIGATION  1998    Family History  Problem Relation Age of Onset   Cancer Mother 7278     colon 2010   Diabetes Mother        type II   Colon cancer Mother    Macular degeneration Mother    Other Mother        trigeminal neuralgia   Atrial fibrillation Mother        died from cardiac causes   Coronary artery disease Father        [id number] CABG   Atrial fibrillation Father    Hypertension Brother    Hypertension Brother    Hyperlipidemia Brother    Diabetes Mellitus II Maternal Grandfather    Atrial fibrillation Maternal Grandfather    Cancer Paternal Grandfather        unsure of cancer    Hodgkin's lymphoma Son    Esophageal cancer Neg Hx    Rectal cancer Neg Hx    Stomach cancer Neg Hx     Social History   Socioeconomic History   Marital status: Married    Spouse name: Not on file   Number of children: Not on file   Years of education: Not on file   Highest education level: Not on file  Occupational History    Employer:   Tobacco Use   Smoking status: Never   Smokeless tobacco: Never  Vaping Use   Vaping Use: Never used  Substance and Sexual Activity   Alcohol use: Yes    Alcohol/week: 3.0 standard drinks of alcohol    Types: 3 Glasses of wine per week   Drug use: No   Sexual activity: Yes  Other Topics Concern   Not on file  Social History Narrative   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     Cholecalciferol (VITAMIN D3) 2400 UNIT/ML LIQD by Does not apply route.     clobetasol (TEMOVATE) 0.05 % external solution Apply 1 application topically 2 (two) times daily.     ketoconazole (NIZORAL) 2 % shampoo Apply 1 application topically 2 (two) times a week.     Multiple Vitamin (MULTIVITAMIN) tablet Take 1 tablet by mouth daily.     amLODipine (NORVASC) 5 MG tablet TAKE 1 TABLET (5 MG TOTAL) BY MOUTH DAILY. 90 tablet 1   No facility-administered medications prior to visit.    Allergies  Allergen Reactions   Shrimp [Shellfish Allergy] Nausea And Vomiting    09/29/12  Pt states she can tolerate betadine and IV dyes. Tf,cma(aama)    Review of  Systems  Constitutional:  Negative for weight loss.  HENT:  Negative for congestion and hearing loss.   Eyes:  Negative for blurred vision.  Respiratory:  Negative for cough and shortness of breath.   Cardiovascular:  Negative for chest pain.  Gastrointestinal:  Negative for constipation and diarrhea.  Genitourinary:  Negative for dysuria and frequency.  Musculoskeletal:  Positive for joint pain (mild arthritis in her hands). Negative for myalgias.  Neurological:  Negative for headaches.  Psychiatric/Behavioral:         Denies depression/anxiety       Objective:    Physical Exam  BP (!) 137/50   Pulse 64   Temp 98.3 F (36.8 C)   Resp 18   Ht 5' 8"  (1.727 m)   Wt 184 lb 12.8 oz (83.8 kg)   SpO2 98%   BMI 28.10 kg/m  Wt Readings from Last 3 Encounters:  06/28/21 184 lb 12.8 oz (83.8 kg)  12/28/20 181 lb (82.1 kg)  06/24/20 175 lb (79.4 kg)      Physical Exam  Constitutional: She is oriented to person, place, and time. She appears well-developed and well-nourished. No distress.  HENT:  Head: Normocephalic and atraumatic.  Right Ear: Tympanic membrane and ear canal normal.  Left Ear: Tympanic membrane and ear canal normal.  Mouth/Throat: Oropharynx is clear and moist.  Eyes: Pupils are equal, round, and reactive to light. No scleral icterus.  Neck: Normal range of motion. No thyromegaly present.  Cardiovascular: Normal rate and regular rhythm.   No murmur heard. Pulmonary/Chest: Effort normal and breath sounds normal. No respiratory distress. He has no wheezes. She has no rales. She exhibits no tenderness.  Abdominal: Soft. Bowel sounds are normal. She exhibits no distension and no mass. There is no tenderness. There is no rebound and no guarding.  Musculoskeletal: She exhibits no edema.  Lymphadenopathy:    She has no cervical adenopathy.  Neurological: She is alert and oriented to person, place, and time. She has normal patellar reflexes. She exhibits normal muscle  tone. Coordination normal.  Skin: Skin is warm and dry.  Psychiatric: She has a normal mood and affect. Her behavior is normal. Judgment and thought content normal.  Breast/Pelvis:  deferred         Assessment & Plan:    Assessment & Plan:   Problem List Items Addressed This  Visit       Unprioritized   Routine general medical examination at a health care facility - Primary    Discussed healthy diet, exercise, weight loss.  Mammo due this fall, colo 2024.  Immunizations reviewed and up to date.       Hypertension    BP stable on amlodipine 56m once daily. Continue same.       Relevant Medications   amLODipine (NORVASC) 5 MG tablet   Other Visit Diagnoses     Encounter for screening mammogram for malignant neoplasm of breast       Relevant Orders   MM 3D SCREEN BREAST BILATERAL   Hyperlipidemia, unspecified hyperlipidemia type       Relevant Medications   amLODipine (NORVASC) 5 MG tablet   Other Relevant Orders   Comp Met (CMET)   Lipid panel       I am having JSallee Provencalmaintain her multivitamin, Calcium-Magnesium-Vitamin D (CALCIUM 500 PO), clobetasol, ketoconazole, Vitamin D3, and amLODipine.  Meds ordered this encounter  Medications   amLODipine (NORVASC) 5 MG tablet    Sig: TAKE 1 TABLET (5 MG TOTAL) BY MOUTH DAILY.    Dispense:  90 tablet    Refill:  1    Order Specific Question:   Supervising Provider    Answer:   BPenni HomansA [[id number]]

## 2021-07-04 ENCOUNTER — Telehealth (HOSPITAL_BASED_OUTPATIENT_CLINIC_OR_DEPARTMENT_OTHER): Payer: Self-pay

## 2021-07-12 ENCOUNTER — Telehealth: Payer: 59 | Admitting: Physician Assistant

## 2021-07-12 DIAGNOSIS — L959 Vasculitis limited to the skin, unspecified: Secondary | ICD-10-CM

## 2021-07-12 DIAGNOSIS — R21 Rash and other nonspecific skin eruption: Secondary | ICD-10-CM

## 2021-07-13 ENCOUNTER — Other Ambulatory Visit (HOSPITAL_BASED_OUTPATIENT_CLINIC_OR_DEPARTMENT_OTHER): Payer: Self-pay

## 2021-07-13 ENCOUNTER — Telehealth: Payer: Self-pay

## 2021-07-13 ENCOUNTER — Ambulatory Visit: Payer: 59 | Admitting: Family Medicine

## 2021-07-13 ENCOUNTER — Encounter: Payer: Self-pay | Admitting: Family Medicine

## 2021-07-13 VITALS — BP 146/63 | HR 72 | Ht 68.0 in | Wt 187.0 lb

## 2021-07-13 DIAGNOSIS — L237 Allergic contact dermatitis due to plants, except food: Secondary | ICD-10-CM | POA: Diagnosis not present

## 2021-07-13 MED ORDER — PREDNISONE 20 MG PO TABS
ORAL_TABLET | ORAL | 0 refills | Status: AC
  Filled 2021-07-13: qty 18, 15d supply, fill #0

## 2021-07-13 NOTE — Progress Notes (Signed)
Because of some atypical rash characteristics on the pictures you shared, I feel your condition warrants further evaluation and I recommend that you be seen in a face to face visit. I want to make sure you have not developed a vasculitis giving how some of the areas of rash appear. I want you to be seen in-person either with your PCP or at local Urgent care just so you can get a detailed examination and the proper treatment(s) given.    NOTE: There will be NO CHARGE for this eVisit   If you are having a true medical emergency please call 911.      For an urgent face to face visit, [place] has seven urgent care centers for your convenience:     [facility] Health Urgent Care Center at [facility] Directions [phone] [address] [place], [place] [address]    [facility] Health Urgent Care Center [facility]) Get Driving Directions [phone] [address] [place], [place] [address]  [facility] Urgent Care Center [facility]) Get Driving Directions [phone] [address] [place],  [place]  [address]  [facility] Health Urgent Care Center [facility] - at [employer] Directions  [phone] [address] [place],  [place] [address]   [facility] Urgent Care at [facility] Get Driving Directions [phone] [address] [place], [place] [address]   [facility] Urgent Care at [facility] Get Driving Directions  [phone] [address] [place], [place] [address]   [facility] Health Urgent Care at [facility] Directions [phone] [address] [place], [place] [address]  Your MyChart E-visit questionnaire answers were reviewed by a board certified advanced clinical practitioner to complete your personal care plan based on your specific symptoms.  Thank you for using e-Visits.

## 2021-07-13 NOTE — Progress Notes (Signed)
Acute Office Visit  Subjective:     Patient ID: Dominique Rice, female    DOB: [birth year], 61 y.o.   MRN: [id number]  CC: poison ivy rash   Rash This is a new problem. The current episode started 1 to 4 weeks ago. The problem has been gradually worsening since onset. The affected locations include the abdomen, left lower leg, right lower leg, right upper leg and left upper leg. The rash is characterized by dryness, redness, itchiness and peeling. She was exposed to plant contact (poison ivy working in the yard). Pertinent negatives include no anorexia, congestion, cough, diarrhea, eye pain, facial edema, fatigue, fever, joint pain, nail changes, rhinorrhea, shortness of breath, sore throat or vomiting. Past treatments include antihistamine and anti-itch cream. The treatment provided mild relief.  She was working in the yard about 1.5 weeks and saw poison ivy. She was wearing gloves, but was in shorts. Legs are primary area affected. She has been getting some temporary relief with calamine lotions and benadryl. Rash is raised, erythematous lesions, varying stages of vesicles, but feels like it is still spreading.       ROS: All review of systems negative except what is listed in the HPI        Objective:    BP (!) 146/63   Pulse 72   Ht 5\' 8"  (1.727 m)   Wt 187 lb (84.8 kg)   BMI 28.43 kg/m    Physical Exam Vitals reviewed.  Constitutional:      General: She is not in acute distress.    Appearance: Normal appearance. She is not ill-appearing.  Pulmonary:     Effort: Pulmonary effort is normal. No respiratory distress.     Breath sounds: Normal breath sounds.  Musculoskeletal:        General: Normal range of motion.  Skin:    General: Skin is warm and dry.     Findings: Rash present.     Comments: Erythematous vesicular rash of varying stages to bilateral legs; healing area to left abdomen; no surrounding edema, erythema, purulent drainage   Neurological:      General: No focal deficit present.     Mental Status: She is alert and oriented to person, place, and time. Mental status is at baseline.  Psychiatric:        Mood and Affect: Mood normal.        Behavior: Behavior normal.        Thought Content: Thought content normal.        Judgment: Judgment normal.       No results found for any visits on 07/13/21.      Assessment & Plan:   Problem List Items Addressed This Visit   None Visit Diagnoses     Poison ivy dermatitis    -  Primary Discussed options with patient, given duration, spread and severity, she would like to try prednisone taper. States she has tolerated prednisone well in the past. Continue supportive measures (topical creams/lotions, oatmeal baths, antihistamines, etc.). Monitor for any signs of infection. Patient aware of signs/symptoms requiring further/urgent evaluation.    Relevant Medications   predniSONE (DELTASONE) 20 MG tablet       Meds ordered this encounter  Medications   predniSONE (DELTASONE) 20 MG tablet    Sig: Take 2 tablets (40 mg total) by mouth daily with breakfast for 5 days, THEN 1 tablet (20 mg total) daily with breakfast for 5 days, THEN 1/2 tablet (10 mg total)  daily with breakfast for 5 days.    Dispense:  18 tablet    Refill:  0    Order Specific Question:   Supervising Provider    Answer:   Danise Edge A [[id number]]    Return if symptoms worsen or fail to improve.  Clayborne Dana, NP

## 2021-07-13 NOTE — Telephone Encounter (Signed)
Nurse Assessment Nurse: Doylene Canard, RN, Rinaldo Cloud Date/Time (Eastern Time): 07/13/2021 7:14:14 AM Confirm and document reason for call. If symptomatic, describe symptoms. ---Caller states she has had poison ivy rash on both legs for a week and a half, has used several OTC creams and oral meds but not effective and rash is worsening. Had electronic visit last night and sent pictures, received message back from provider that she needs to be seen. Does the patient have any new or worsening symptoms? ---Yes Will a triage be completed? ---Yes Related visit to physician within the last 2 weeks? ---No Does the PT have any chronic conditions? (i.e. diabetes, asthma, this includes High risk factors for pregnancy, etc.) ---No Is this a behavioral health or substance abuse call? ---No Guidelines Guideline Title Affirmed Question Affirmed Notes Nurse Date/Time Lamount Cohen Time) Poison Ivy - [place] [1] Skin around the rash has become red AND [2] larger than 2 inches (5 cm) Conner, RN, Rinaldo Cloud 07/13/2021 7:18:14 AM Disp. Time Lamount Cohen Time) Disposition Final User 07/13/2021 7:20:26 AM See HCP within 4 Hours (or PCP triage) Yes Conner, RN, Rinaldo Cloud PLEASE NOTE: All timestamps contained within this report are represented as [place] Standard Time. CONFIDENTIALTY NOTICE: This fax transmission is intended only for the addressee. It contains information that is legally privileged, confidential or otherwise protected from use or disclosure. If you are not the intended recipient, you are strictly prohibited from reviewing, disclosing, copying using or disseminating any of this information or taking any action in reliance on or regarding this information. If you have received this fax in error, please notify us immediately by telephone so that we can arrange for its return to [place]. Phone: [phone], Toll-Free: [phone], Fax: [phone] Page: 2 of 2 Call Id: [id number] Final Disposition 07/13/2021  7:20:26 AM See HCP within 4 Hours (or PCP triage) Yes Doylene Canard, RN, Joaquin Courts Disagree/Comply Comply Caller Understands Yes PreDisposition Call Doctor Care Advice Given Per Guideline SEE HCP (OR PCP TRIAGE) WITHIN 4 HOURS: * IF OFFICE WILL BE OPEN: You need to be seen within the next 3 or 4 hours. Call your doctor (or NP/PA) now or as soon as the office opens. CALL BACK IF: * You become worse CARE ADVICE given per Poison Ivy - [place] (Adult) guideline. Referrals REFERRED TO PCP OFFICE

## 2021-07-13 NOTE — Telephone Encounter (Signed)
Appt scheduled

## 2021-09-27 ENCOUNTER — Other Ambulatory Visit (HOSPITAL_BASED_OUTPATIENT_CLINIC_OR_DEPARTMENT_OTHER): Payer: Self-pay

## 2021-10-10 ENCOUNTER — Encounter (HOSPITAL_BASED_OUTPATIENT_CLINIC_OR_DEPARTMENT_OTHER): Payer: Self-pay

## 2021-10-10 ENCOUNTER — Ambulatory Visit (HOSPITAL_BASED_OUTPATIENT_CLINIC_OR_DEPARTMENT_OTHER)
Admission: RE | Admit: 2021-10-10 | Discharge: 2021-10-10 | Disposition: A | Discharge status: Home / Self Care | Payer: 59 | Source: Ambulatory Visit | Attending: Family | Admitting: Family

## 2021-10-10 DIAGNOSIS — Z1231 Encounter for screening mammogram for malignant neoplasm of breast: Secondary | ICD-10-CM | POA: Diagnosis not present

## 2021-10-11 ENCOUNTER — Ambulatory Visit (HOSPITAL_BASED_OUTPATIENT_CLINIC_OR_DEPARTMENT_OTHER): Payer: 59

## 2021-12-01 ENCOUNTER — Other Ambulatory Visit (HOSPITAL_BASED_OUTPATIENT_CLINIC_OR_DEPARTMENT_OTHER): Payer: Self-pay

## 2021-12-01 ENCOUNTER — Telehealth: Payer: 59 | Admitting: Family Medicine

## 2021-12-01 DIAGNOSIS — R051 Acute cough: Secondary | ICD-10-CM

## 2021-12-01 DIAGNOSIS — B9689 Other specified bacterial agents as the cause of diseases classified elsewhere: Secondary | ICD-10-CM

## 2021-12-01 DIAGNOSIS — J019 Acute sinusitis, unspecified: Secondary | ICD-10-CM | POA: Diagnosis not present

## 2021-12-01 MED ORDER — AMOXICILLIN-POT CLAVULANATE 875-125 MG PO TABS
1.0000 | ORAL_TABLET | Freq: Two times a day (BID) | ORAL | 0 refills | Status: AC
  Filled 2021-12-01: qty 14, 7d supply, fill #0

## 2021-12-01 MED ORDER — PROMETHAZINE-DM 6.25-15 MG/5ML PO SYRP
5.0000 mL | ORAL_SOLUTION | Freq: Three times a day (TID) | ORAL | 0 refills | Status: DC | PRN
  Filled 2021-12-01: qty 118, 8d supply, fill #0

## 2021-12-01 NOTE — Progress Notes (Signed)
E-Visit for Sinus Problems  We are sorry that you are not feeling well.  Here is how we plan to help!  Based on what you have shared with me it looks like you have sinusitis.  Sinusitis is inflammation and infection in the sinus cavities of the head.  Based on your presentation I believe you most likely have Acute Bacterial Sinusitis.  This is an infection caused by bacteria and is treated with antibiotics. I have prescribed Augmentin 875mg /125mg  one tablet twice daily with food, for 7 days. You may use an oral decongestant such as Mucinex D or if you have glaucoma or high blood pressure use plain Mucinex. Saline nasal spray help and can safely be used as often as needed for congestion.  If you develop worsening sinus pain, fever or notice severe headache and vision changes, or if symptoms are not better after completion of antibiotic, please schedule an appointment with a health care provider.    Sinus infections are not as easily transmitted as other respiratory infection, however we still recommend that you avoid close contact with loved ones, especially the very young and elderly.  Remember to wash your hands thoroughly throughout the day as this is the number one way to prevent the spread of infection!  I will also order Promethazine DM cough syrup- please be aware this can make you sleepy.  Home Care: Only take medications as instructed by your medical team. Complete the entire course of an antibiotic. Do not take these medications with alcohol. A steam or ultrasonic humidifier can help congestion.  You can place a towel over your head and breathe in the steam from hot water coming from a faucet. Avoid close contacts especially the very young and the elderly. Cover your mouth when you cough or sneeze. Always remember to wash your hands.  Get Help Right Away If: You develop worsening fever or sinus pain. You develop a severe head ache or visual changes. Your symptoms persist after you have  completed your treatment plan.  Make sure you Understand these instructions. Will watch your condition. Will get help right away if you are not doing well or get worse.  Thank you for choosing an e-visit.  Your e-visit answers were reviewed by a board certified advanced clinical practitioner to complete your personal care plan. Depending upon the condition, your plan could have included both over the counter or prescription medications.  Please review your pharmacy choice. Make sure the pharmacy is open so you can pick up prescription now. If there is a problem, you may contact your provider through and have the prescription routed to another pharmacy.  Your safety is important to [employer]. If you have drug allergies check your prescription carefully.   For the next 24 hours you can use MyChart to ask questions about today's visit, request a non-urgent call back, or ask for a work or school excuse. You will get an email in the next two days asking about your experience. I hope that your e-visit has been valuable and will speed your recovery.  I provided 5 minutes of non face-to-face time during this encounter for chart review, medication and order placement, as well as and documentation.

## 2021-12-29 ENCOUNTER — Encounter: Payer: Self-pay | Admitting: Family

## 2021-12-29 ENCOUNTER — Other Ambulatory Visit (HOSPITAL_BASED_OUTPATIENT_CLINIC_OR_DEPARTMENT_OTHER): Payer: Self-pay

## 2021-12-29 ENCOUNTER — Ambulatory Visit: Payer: 59 | Admitting: Family

## 2021-12-29 VITALS — BP 128/65 | HR 73 | Temp 97.8°F | Resp 16 | Ht 68.0 in | Wt 184.1 lb

## 2021-12-29 DIAGNOSIS — I10 Essential (primary) hypertension: Secondary | ICD-10-CM

## 2021-12-29 MED ORDER — AMLODIPINE BESYLATE 5 MG PO TABS
5.0000 mg | ORAL_TABLET | Freq: Every day | ORAL | 1 refills | Status: DC
  Filled 2021-12-29: qty 90, 90d supply, fill #0
  Filled 2022-03-27: qty 90, 90d supply, fill #1

## 2021-12-29 NOTE — Progress Notes (Signed)
Subjective:   By signing my name below, I, [name], attest that this documentation has been prepared under the direction and in the presence of Dominique Chimera, NP 12/29/2021   Patient ID: Dominique Rice, female    DOB: 28-Jul-1960, 61 y.o.   MRN: [id number]  Chief Complaint  Patient presents with   Follow-up    Not Fasting    HPI Patient is in today for an office visit   Refill: She is requesting a refill of 5 mg of Amlodipine.   Blood Pressure: As of today's visit, her blood pressure is slightly elevated. She is currently taking 5 mg of Amlodipine. She regularly checks her blood pressure at home and states that her systolic pressure is around the 120's. Upon recheck, her blood pressure was 128/65.  BP Readings from Last 3 Encounters:  12/29/21 128/65  07/13/21 (!) 146/63  06/28/21 (!) 137/50   Pulse Readings from Last 3 Encounters:  12/29/21 73  07/13/21 72  06/28/21 64   Augmentin: She reports that she was prescribed 875-125 mg of Augmentin for her acute bacterial sinusitis and stated that she vomited after her first dose.   Colonoscopy: Last completed on 12/15/2012  Mammogram: Last completed on 10/10/2021  Immunizations: She is UTD on the shingles and influenza vaccine.   Health Maintenance Due  Topic Date Due   COVID-19 Vaccine (6 - 2023-24 season) 09/15/2021    Past Medical History:  Diagnosis Date   Hypertension    Osteopenia    Pneumonia     Past Surgical History:  Procedure Laterality Date   ABDOMINAL HYSTERECTOMY     CESAREAN SECTION     twice   COLONOSCOPY  2014   ENDOMETRIAL ABLATION  2005   OOPHORECTOMY Left 1998   ORIF ANKLE FRACTURE Left 12/16/2018   Procedure: OPEN REDUCTION INTERNAL FIXATION (ORIF) Left ankle trimalleolar fracture;  Surgeon: Wylene Simmer, MD;  Location: [place];  Service: Orthopedics;  Laterality: Left;  27min   TUBAL LIGATION  1998    Family History  Problem Relation Age of Onset    Cancer Mother 2       colon 2010   Diabetes Mother        type II   Colon cancer Mother    Macular degeneration Mother    Other Mother        trigeminal neuralgia   Atrial fibrillation Mother        died from cardiac causes   Coronary artery disease Father        42 had CABG   Atrial fibrillation Father    Hypertension Brother    Hypertension Brother    Hyperlipidemia Brother    Diabetes Mellitus II Maternal Grandfather    Atrial fibrillation Maternal Grandfather    Cancer Paternal Grandfather        unsure of cancer   Hodgkin's lymphoma Son    Esophageal cancer Neg Hx    Rectal cancer Neg Hx    Stomach cancer Neg Hx     Social History   Socioeconomic History   Marital status: Married    Spouse name: Not on file   Number of children: Not on file   Years of education: Not on file   Highest education level: Not on file  Occupational History    Employer: [employer]  Tobacco Use   Smoking status: Never   Smokeless tobacco: Never  Vaping Use   Vaping Use: Never used  Substance  and Sexual Activity   Alcohol use: Yes    Alcohol/week: 3.0 standard drinks of alcohol    Types: 3 Glasses of wine per week   Drug use: No   Sexual activity: Yes  Other Topics Concern   Not on file  Social History Narrative   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     Cholecalciferol (VITAMIN D3) 2400 UNIT/ML LIQD by Does not apply route.     clobetasol (TEMOVATE) 0.05 % external solution  Apply 1 application topically 2 (two) times daily.     ketoconazole (NIZORAL) 2 % shampoo Apply 1 application topically 2 (two) times a week.     Multiple Vitamin (MULTIVITAMIN) tablet Take 1 tablet by mouth daily.     amLODipine (NORVASC) 5 MG tablet TAKE 1 TABLET (5 MG TOTAL) BY MOUTH DAILY. 90 tablet 1   promethazine-dextromethorphan (PROMETHAZINE-DM) 6.25-15 MG/5ML syrup Take 5 mLs by mouth 3 (three) times daily as needed for cough. (Patient not taking: Reported on 12/29/2021) 118 mL 0   No facility-administered medications prior to visit.    Allergies  Allergen Reactions   Amoxicillin-Pot Clavulanate    Shrimp [Shellfish Allergy] Nausea And Vomiting    09/29/12  Pt states she can tolerate betadine and IV dyes. Tf,cma(aama)    ROS  See HPI     Objective:    Physical Exam Constitutional:      General: She is not in acute distress.    Appearance: Normal appearance. She is not ill-appearing.  HENT:     Head: Normocephalic and atraumatic.     Right Ear: External ear normal.     Left Ear: External ear normal.  Eyes:     Extraocular Movements: Extraocular movements intact.     Pupils: Pupils are equal, round, and reactive to light.  Cardiovascular:     Rate and Rhythm: Normal rate and regular rhythm.     Heart sounds: Normal heart sounds. No murmur heard.    No gallop.  Pulmonary:     Effort: Pulmonary effort is normal. No respiratory distress.     Breath sounds: Normal breath sounds. No wheezing or rales.  Skin:    General: Skin is warm and dry.  Neurological:     Mental Status: She is alert and oriented to person, place, and time.  Psychiatric:        Mood and Affect: Mood normal.        Behavior: Behavior normal.        Judgment: Judgment normal.     BP 128/65 (BP Location: Left Arm, Patient Position: Sitting)   Pulse 73   Temp 97.8 F (36.6 C) (Oral)   Resp 16   Ht 5\' 8"  (1.727 m)   Wt 184 lb 2 oz (83.5 kg)   SpO2 100%   BMI 28.00 kg/m  Wt Readings from  Last 3 Encounters:  12/29/21 184 lb 2 oz (83.5  kg)  07/13/21 187 lb (84.8 kg)  06/28/21 184 lb 12.8 oz (83.8 kg)       Assessment & Plan:   Problem List Items Addressed This Visit       Unprioritized   Hypertension - Primary    Initial bp was high but repeat BP WNL. Continue amlodipine 5mg .       Relevant Medications   amLODipine (NORVASC) 5 MG tablet   Meds ordered this encounter  Medications   amLODipine (NORVASC) 5 MG tablet    Sig: Take 1 tablet (5 mg total) by mouth daily.    Dispense:  30 tablet    Refill:  11    Order Specific Question:   Supervising Provider    Answer:   A [[id number]]    I, Danise Edge, NP, personally preformed the services described in this documentation.  All medical record entries made by the scribe were at my direction and in my presence.  I have reviewed the chart and discharge instructions (if applicable) and agree that the record reflects my personal performance and is accurate and complete. 12/29/2021   I,Amber Collins,acting as a scribe for 12/31/2021, NP.,have documented all relevant documentation on the behalf of Lemont Fillers, NP,as directed by  Lemont Fillers, NP while in the presence of Lemont Fillers, NP.    Lemont Fillers, NP

## 2021-12-29 NOTE — Assessment & Plan Note (Signed)
Initial bp was high but repeat BP WNL. Continue amlodipine 5mg .

## 2022-01-03 ENCOUNTER — Other Ambulatory Visit (HOSPITAL_BASED_OUTPATIENT_CLINIC_OR_DEPARTMENT_OTHER): Payer: Self-pay

## 2022-01-03 MED ORDER — COMIRNATY 30 MCG/0.3ML IM SUSY
PREFILLED_SYRINGE | INTRAMUSCULAR | 0 refills | Status: DC
  Filled 2022-01-03: qty 0.3, 1d supply, fill #0

## 2022-01-13 IMAGING — CT CT ANKLE*L* W/O CM
3 series · 10 of 33 positions shown, 12 images · non-contrast
Comparison: CT scan of the ankle dated 12/08/2019

CLINICAL DATA: Trimalleolar fracture with open reduction and
internal fixation in November 2018. Foot and toe numbness. Great toe
weakness.

EXAM:
CT OF THE LEFT ANKLE WITHOUT CONTRAST
TECHNIQUE: Multidetector CT imaging of the left ankle was performed according
to the standard protocol. Multiplanar CT image reconstructions were
also generated.

[Series 7: axial st · axial · 0.19mm/px · z∈[-146,-48]mm · 2 of 212 slices shown, 3 images]
[im 65/212  soft-tissue]
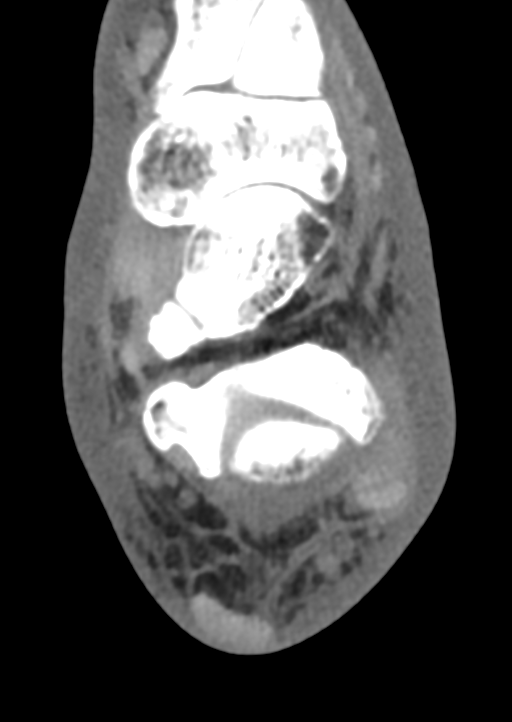
[im 65/212  bone]
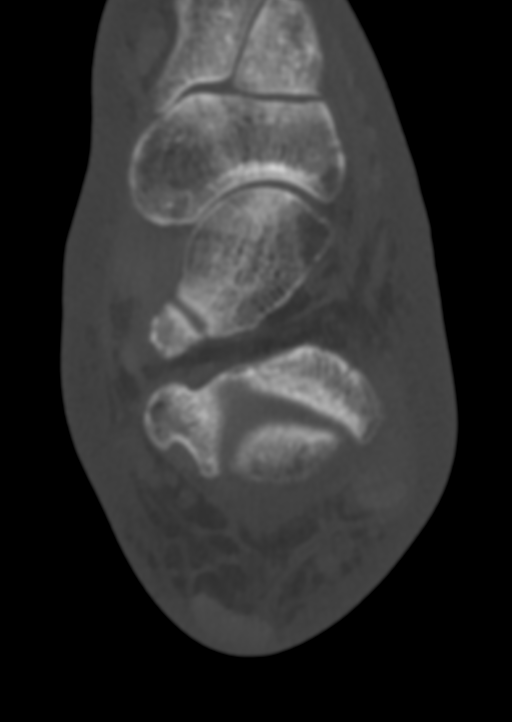
[im 163/212  bone]
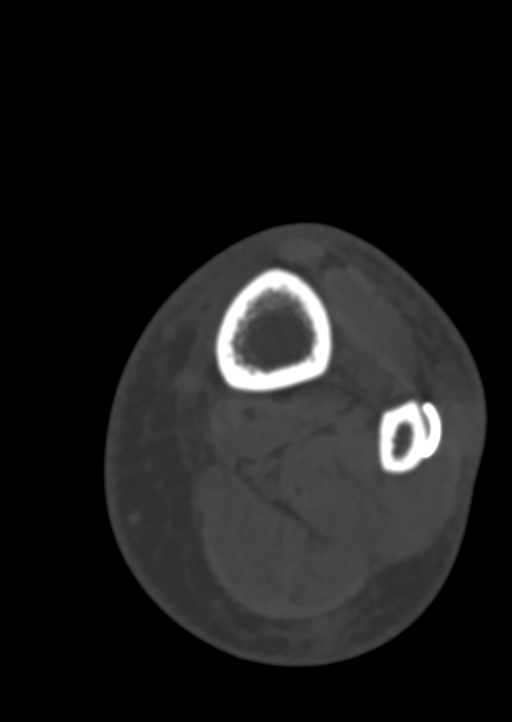

[Series 8: cor st · coronal · 0.19mm/px · 3 of 134 slices shown]
[im 27/134  bone]
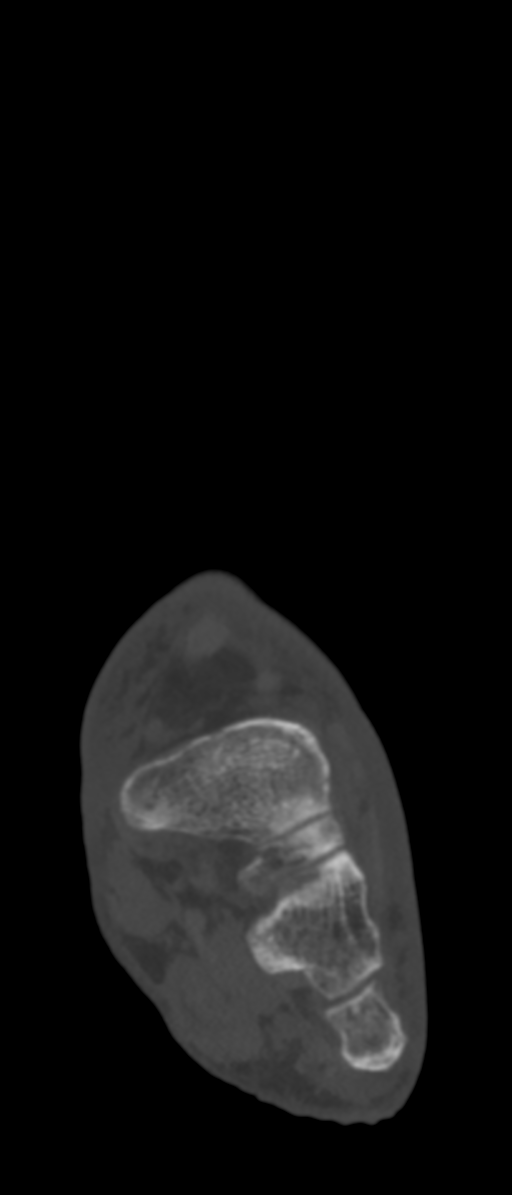
[im 54/134  bone]
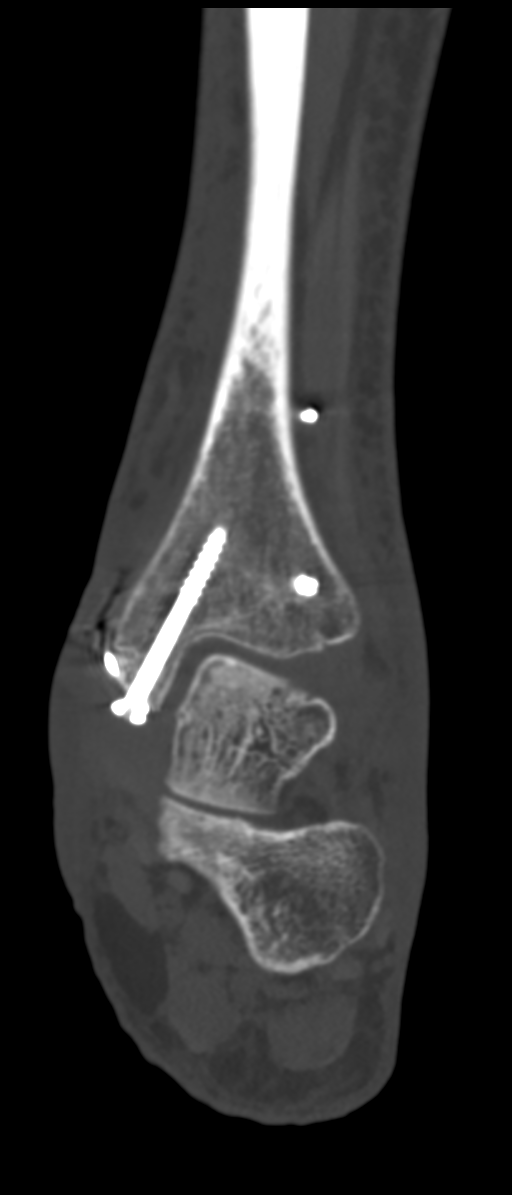
[im 80/134  bone]
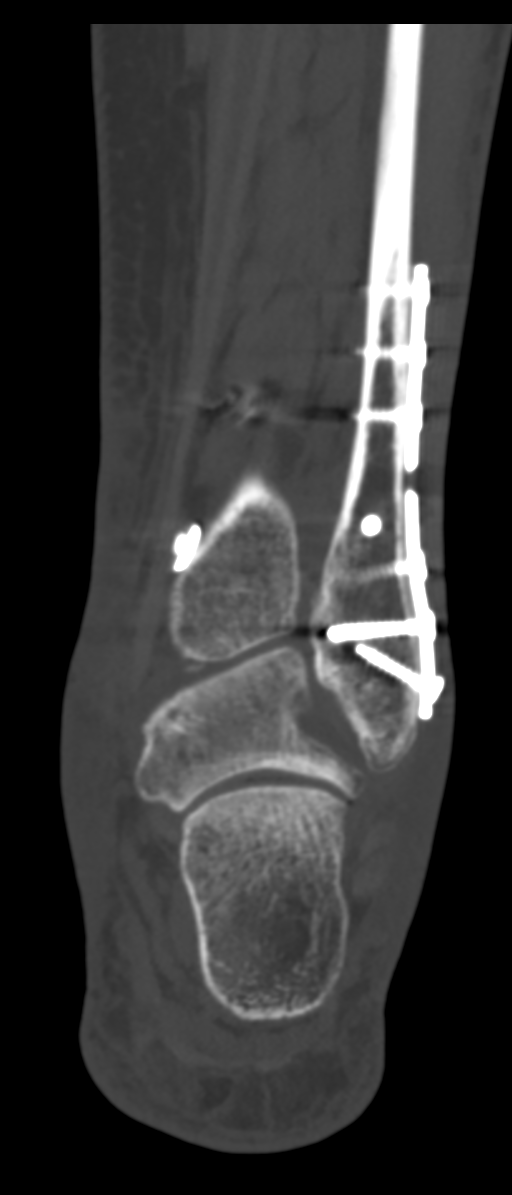

[Series 9: sag st · sagittal · 0.30mm/px · 5 of 94 slices shown, 6 images]
[im 32/94  bone]
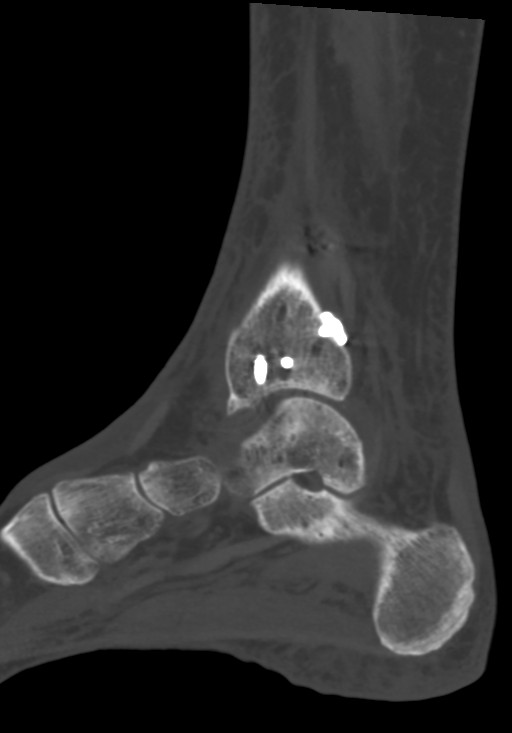
[im 39/94  bone]
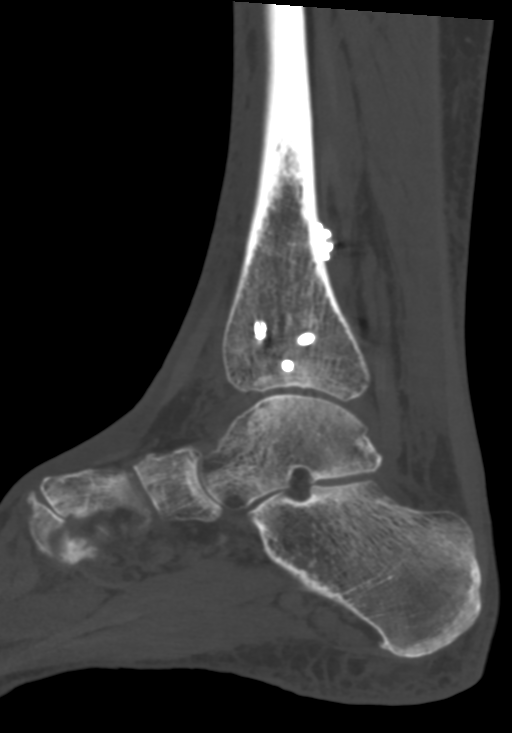
[im 47/94  soft-tissue]
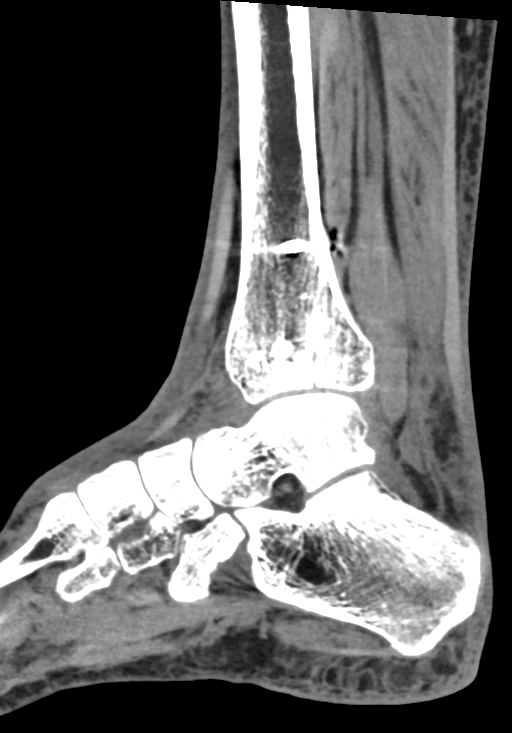
[im 47/94  bone]
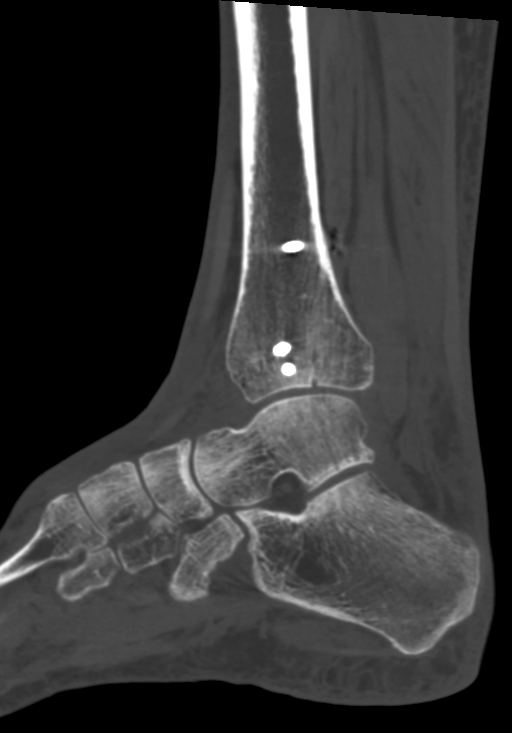
[im 55/94  bone]
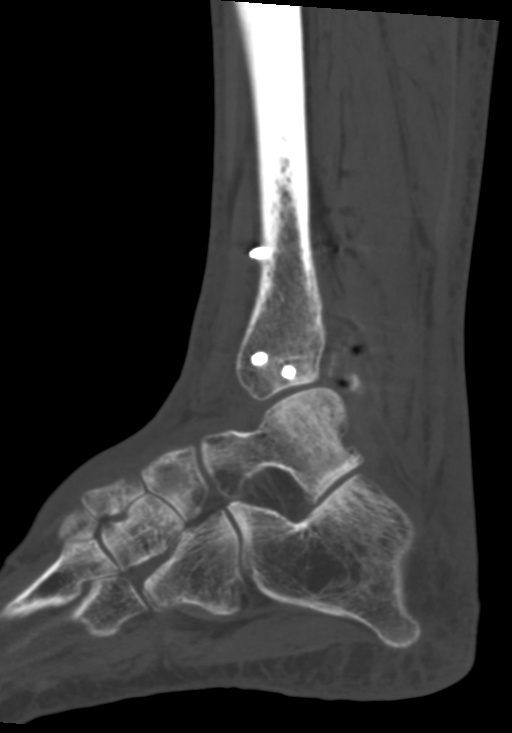
[im 63/94  bone]
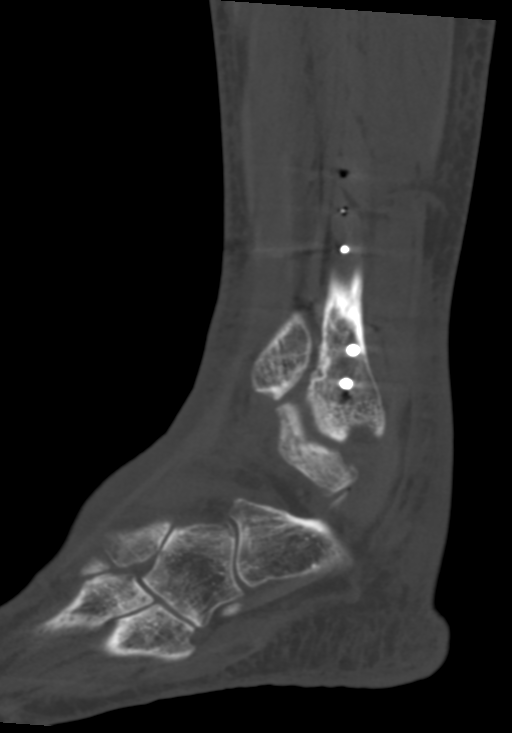

[10 of 33 positions shown; findings below may reference images not displayed]

FINDINGS: Bones/Joint/Cartilage

There has been healing of the distal tibia and fibular fractures
with minimal residual deformity. The hardware appears in excellent
position in the distal fibula and in the distal tibia. No evidence
of hardware loosening.

No significant ankle or subtalar joint effusions.

Ligaments

Suboptimally assessed by CT. The ligaments of the ankle are not well
seen on CT.

Muscles and Tendons

The muscles and tendons around the ankle appear intact and properly
located.

Soft tissues

There is subcutaneous edema circumferentially at the ankle extending
onto the dorsum of the foot, nonspecific.
IMPRESSION: 1. Healing of the distal tibia and fibular fractures with minimal
residual deformity.
2. Nonspecific subcutaneous edema at the ankle extending onto the
dorsum of the foot.

## 2022-03-28 ENCOUNTER — Other Ambulatory Visit (HOSPITAL_BASED_OUTPATIENT_CLINIC_OR_DEPARTMENT_OTHER): Payer: Self-pay

## 2022-03-28 MED ORDER — AZITHROMYCIN 250 MG PO TABS
ORAL_TABLET | ORAL | 0 refills | Status: DC
  Filled 2022-03-28: qty 6, 5d supply, fill #0

## 2022-07-02 ENCOUNTER — Encounter: Payer: Commercial Managed Care - PPO | Admitting: Family

## 2022-07-04 ENCOUNTER — Other Ambulatory Visit: Payer: Self-pay | Admitting: Family

## 2022-07-04 ENCOUNTER — Other Ambulatory Visit (HOSPITAL_BASED_OUTPATIENT_CLINIC_OR_DEPARTMENT_OTHER): Payer: Self-pay

## 2022-07-04 ENCOUNTER — Encounter: Payer: Commercial Managed Care - PPO | Admitting: Family

## 2022-07-04 MED ORDER — AMLODIPINE BESYLATE 5 MG PO TABS
5.0000 mg | ORAL_TABLET | Freq: Every day | ORAL | 0 refills | Status: DC
  Filled 2022-07-04: qty 90, 90d supply, fill #0

## 2022-07-10 ENCOUNTER — Ambulatory Visit (INDEPENDENT_AMBULATORY_CARE_PROVIDER_SITE_OTHER): Payer: 59 | Admitting: Family

## 2022-07-10 ENCOUNTER — Encounter: Payer: Self-pay | Admitting: Family

## 2022-07-10 VITALS — BP 138/67 | HR 73 | Temp 98.4°F | Resp 16 | Wt 185.0 lb

## 2022-07-10 DIAGNOSIS — E785 Hyperlipidemia, unspecified: Secondary | ICD-10-CM | POA: Diagnosis not present

## 2022-07-10 DIAGNOSIS — I10 Essential (primary) hypertension: Secondary | ICD-10-CM

## 2022-07-10 DIAGNOSIS — Z1211 Encounter for screening for malignant neoplasm of colon: Secondary | ICD-10-CM

## 2022-07-10 DIAGNOSIS — M8580 Other specified disorders of bone density and structure, unspecified site: Secondary | ICD-10-CM

## 2022-07-10 DIAGNOSIS — Z1231 Encounter for screening mammogram for malignant neoplasm of breast: Secondary | ICD-10-CM

## 2022-07-10 DIAGNOSIS — Z0000 Encounter for general adult medical examination without abnormal findings: Secondary | ICD-10-CM | POA: Diagnosis not present

## 2022-07-10 NOTE — Assessment & Plan Note (Signed)
Continue healthy diet, exercise, weight loss.  Immunizations reviewed and up to date. Plan to update mammo and colo this year.

## 2022-07-10 NOTE — Progress Notes (Signed)
Subjective:     Patient ID: Dominique Rice, female    DOB: March 29, 1960, 62 y.o.   MRN: [id number]  No chief complaint on file.   HPI l  History of Present Illness   The patient presents for an annual physical. She reports receiving their shingles vaccine and last tetanus shot in 2018. She typically receives their flu shot at work and plans to update their flu shot and COVID booster in the fall. She reports a generally healthy diet and could improve on exercise. She enjoys walking in their neighborhood but finds it challenging to maintain consistency due to weather conditions. Her weight has decreased by two pounds since last year. Her last colonoscopy was in 2014 and she has not yet received a letter to schedule another. Her last bone density test in 2021 showed very mild bone thinning and she takes calcium. Her last mammogram was in 2023 and she plans to schedule another in 2024. She had a hysterectomy and no longer requires Pap smears. She is currently taking amlodipine and vitamins. Her blood pressure is 138 over 67. She has no changes to family medical history, which includes diabetes, hypertension, and colon cancer in her mother at age 8. She is currently working from home and denies any current cough or cold symptoms, swelling in her legs, constipation or diarrhea, urinary issues, unusual muscle pain or joint pain, frequent headaches, and concerns about depression or anxiety. Her cholesterol was mildly elevated last year, but kidney function, liver, and sugar were all normal. Her thyroid was normal three years ago and she is not having any symptoms concerning for thyroid.     Immunizations: up to date Diet:healthy  Wt Readings from Last 3 Encounters:  07/10/22 185 lb (83.9 kg)  12/29/21 184 lb 2 oz (83.5 kg)  07/13/21 187 lb (84.8 kg)  Exercise: walks Colonoscopy: due 12/24 Dexa: 2021 Pap Smear: hysterectomy Mammogram: due in September      Health Maintenance Due  Topic  Date Due   COVID-19 Vaccine (7 - 2023-24 season) 02/28/2022    Past Medical History:  Diagnosis Date   Hypertension    Osteopenia    Pneumonia     Past Surgical History:  Procedure Laterality Date   ABDOMINAL HYSTERECTOMY     CESAREAN SECTION     twice   COLONOSCOPY  2014   ENDOMETRIAL ABLATION  2005   OOPHORECTOMY Left 1998   ORIF ANKLE FRACTURE Left 12/16/2018   Procedure: OPEN REDUCTION INTERNAL FIXATION (ORIF) Left ankle trimalleolar fracture;  Surgeon: Toni Arthurs, MD;  Location: [facility];  Service: Orthopedics;  Laterality: Left;    TUBAL LIGATION  1998    Family History  Problem Relation Age of Onset   Cancer Mother 11       colon 2010   Diabetes Mother        type II   Colon cancer Mother    Macular degeneration Mother    Other Mother        trigeminal neuralgia   Atrial fibrillation Mother        died from cardiac causes   Coronary artery disease Father        73 had CABG   Atrial fibrillation Father    Hypertension Brother    Hypertension Brother    Hyperlipidemia Brother    Diabetes Mellitus II Maternal Grandfather    Atrial fibrillation Maternal Grandfather    Cancer Paternal Grandfather  unsure of cancer   Hodgkin's lymphoma Son    Esophageal cancer Neg Hx    Rectal cancer Neg Hx    Stomach cancer Neg Hx     Social History   Socioeconomic History   Marital status: Married    Spouse name: Not on file   Number of children: Not on file   Years of education: Not on file   Highest education level: Not on file  Occupational History    Employer: [employer]  Tobacco Use   Smoking status: Never   Smokeless tobacco: Never  Vaping Use   Vaping Use: Never used  Substance and Sexual Activity   Alcohol use: Yes    Alcohol/week: 3.0 standard drinks of alcohol    Types: 3 Glasses of wine per week   Drug use: No   Sexual activity: Yes  Other Topics Concern   Not on file  Social History Narrative   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 90 tablet 0   Calcium-Magnesium-Vitamin D (CALCIUM 500 PO) Take 1 tablet by mouth 2 (two) times daily.     Cholecalciferol (VITAMIN D3) 2400 UNIT/ML LIQD by Does not apply route.     clobetasol (TEMOVATE) 0.05 % external solution Apply 1 application topically 2 (two) times daily.     COVID-19 mRNA vaccine 2023-2024 (COMIRNATY) syringe Inject into the muscle. 0.3 mL 0   ketoconazole (NIZORAL) 2 % shampoo Apply 1 application topically 2 (two) times a week.     Multiple Vitamin (MULTIVITAMIN) tablet Take 1 tablet by mouth daily.     azithromycin (ZITHROMAX Z-PAK) 250 MG tablet Take 2 tablets by mouth today then take 1 tablet by mouth everyday for 4 days 6 each 0   No facility-administered medications prior to visit.    Allergies  Allergen Reactions   Amoxicillin-Pot Clavulanate    Shrimp [Shellfish Allergy] Nausea And Vomiting    09/29/12  Pt states she can tolerate betadine and IV dyes. Tf,cma(aama)    Review of Systems  Constitutional:  Negative for weight loss.  HENT:  Negative for congestion and hearing loss.   Eyes:  Negative for blurred vision.  Respiratory:  Negative for cough.   Cardiovascular:  Negative for leg swelling.  Gastrointestinal:  Negative for constipation and diarrhea.  Genitourinary:  Negative for dysuria and frequency.  Musculoskeletal:  Negative for joint pain and myalgias.  Neurological:  Negative for headaches.  Psychiatric/Behavioral:         Denies  depression/anxiety       Objective:    Physical Exam   BP 138/67 (BP Location: Right Arm, Patient Position: Sitting, Cuff Size: Small)   Pulse 73   Temp 98.4 F (36.9 C) (Oral)   Resp 16   Wt 185 lb (83.9 kg)   SpO2 99%   BMI 28.13 kg/m  Wt Readings from Last 3 Encounters:  07/10/22 185 lb (83.9 kg)  12/29/21 184 lb 2 oz (83.5 kg)  07/13/21 187 lb (84.8 kg)   Physical Exam  Constitutional: She is oriented to person, place, and time. She appears well-developed and well-nourished. No distress.  HENT:  Head: Normocephalic and atraumatic.  Right Ear: Tympanic membrane and ear canal normal.  Left Ear: Tympanic membrane and ear canal normal.  Mouth/Throat: Oropharynx is clear and moist.  Eyes: Pupils are equal, round, and reactive to light. No scleral icterus.  Neck: Normal range of motion. No thyromegaly present.  Cardiovascular: Normal rate and regular rhythm.   No murmur heard. Pulmonary/Chest: Effort normal and breath sounds normal. No respiratory distress. He has no wheezes. She has no rales. She exhibits no tenderness.  Abdominal: Soft. Bowel sounds are normal. She exhibits no distension and no mass. There is no tenderness. There is no rebound and no guarding.  Musculoskeletal: She exhibits no edema.  Lymphadenopathy:    She has no cervical adenopathy.  Neurological: She is alert and oriented to person, place, and time. She has normal patellar reflexes. She exhibits normal muscle tone. Coordination normal.  Skin: Skin is warm and dry.  Psychiatric: She has a normal mood and affect. Her behavior is normal. Judgment and thought content normal.  Breast/pelvic: deferred         Assessment & Plan:       Assessment & Plan:   Problem List Items Addressed This Visit       Unprioritized   Routine general medical examination at a health care facility    Continue healthy diet, exercise, weight loss.  Immunizations reviewed and up to date. Plan to update mammo and colo  this year.       Osteopenia   Relevant Orders   DG Bone Density   Hypertension    BP stable, continue amlodipine 5mg  once daily.       Other Visit Diagnoses     Screening for colon cancer    -  Primary   Relevant Orders   Ambulatory referral to Gastroenterology   Breast cancer screening by mammogram       Relevant Orders   MM 3D SCREENING MAMMOGRAM BILATERAL BREAST   Hyperlipidemia, unspecified hyperlipidemia type       Relevant Orders   Lipid panel   Comp Met (CMET)       I have discontinued Penni Bombard. [place]'s azithromycin. I am also having her maintain her multivitamin, Calcium-Magnesium-Vitamin D (CALCIUM 500 PO), clobetasol, ketoconazole, Vitamin D3, Comirnaty, and amLODipine.  No orders of the defined types were placed in this encounter.

## 2022-07-10 NOTE — Assessment & Plan Note (Signed)
BP stable, continue amlodipine 5mg  once daily.

## 2022-07-11 LAB — LIPID PANEL
Cholesterol: 189 mg/dL (ref 0–200)
HDL: 57.9 mg/dL (ref >39.00)
LDL Cholesterol: 102 mg/dL — ABNORMAL HIGH (ref 0–99)
NonHDL: 131.36
Total CHOL/HDL Ratio: 3
Triglycerides: 147 mg/dL (ref 0.0–149.0)
VLDL: 29.4 mg/dL (ref 0.0–40.0)

## 2022-07-11 LAB — COMPREHENSIVE METABOLIC PANEL
ALT: 15 U/L (ref 0–35)
AST: 23 U/L (ref 0–37)
Albumin: 4.1 g/dL (ref 3.5–5.2)
Alkaline Phosphatase: 73 U/L (ref 39–117)
BUN: 19 mg/dL (ref 6–23)
CO2: 27 mEq/L (ref 19–32)
Calcium: 9.2 mg/dL (ref 8.4–10.5)
Chloride: 103 mEq/L (ref 96–112)
Creatinine, Ser: 0.8 mg/dL (ref 0.40–1.20)
GFR: 79.32 mL/min (ref >60.00)
Glucose, Bld: 82 mg/dL (ref 70–99)
Potassium: 4.6 mEq/L (ref 3.5–5.1)
Sodium: 137 mEq/L (ref 135–145)
Total Bilirubin: 0.3 mg/dL (ref 0.2–1.2)
Total Protein: 7.2 g/dL (ref 6.0–8.3)

## 2022-08-23 ENCOUNTER — Encounter: Payer: Self-pay | Admitting: Internal Medicine

## 2022-09-29 ENCOUNTER — Other Ambulatory Visit: Payer: Self-pay | Admitting: Family

## 2022-09-30 MED ORDER — AMLODIPINE BESYLATE 5 MG PO TABS
5.0000 mg | ORAL_TABLET | Freq: Every day | ORAL | 0 refills | Status: DC
  Filled 2022-09-30 – 2022-10-03 (×2): qty 90, 90d supply, fill #0

## 2022-10-01 ENCOUNTER — Other Ambulatory Visit (HOSPITAL_BASED_OUTPATIENT_CLINIC_OR_DEPARTMENT_OTHER): Payer: Self-pay

## 2022-10-03 ENCOUNTER — Other Ambulatory Visit (HOSPITAL_BASED_OUTPATIENT_CLINIC_OR_DEPARTMENT_OTHER): Payer: Self-pay

## 2022-10-17 ENCOUNTER — Other Ambulatory Visit (HOSPITAL_BASED_OUTPATIENT_CLINIC_OR_DEPARTMENT_OTHER): Payer: Self-pay

## 2022-10-17 ENCOUNTER — Ambulatory Visit (HOSPITAL_BASED_OUTPATIENT_CLINIC_OR_DEPARTMENT_OTHER)
Admission: RE | Admit: 2022-10-17 | Discharge: 2022-10-17 | Disposition: A | Discharge status: Home / Self Care | Payer: 59 | Source: Ambulatory Visit | Attending: Family | Admitting: Family

## 2022-10-17 ENCOUNTER — Encounter (HOSPITAL_BASED_OUTPATIENT_CLINIC_OR_DEPARTMENT_OTHER): Payer: Self-pay

## 2022-10-17 DIAGNOSIS — M8580 Other specified disorders of bone density and structure, unspecified site: Secondary | ICD-10-CM | POA: Insufficient documentation

## 2022-10-17 DIAGNOSIS — M85852 Other specified disorders of bone density and structure, left thigh: Secondary | ICD-10-CM | POA: Diagnosis not present

## 2022-10-17 DIAGNOSIS — Z780 Asymptomatic menopausal state: Secondary | ICD-10-CM | POA: Diagnosis not present

## 2022-10-17 DIAGNOSIS — Z1231 Encounter for screening mammogram for malignant neoplasm of breast: Secondary | ICD-10-CM | POA: Diagnosis not present

## 2022-10-17 MED ORDER — COMIRNATY 30 MCG/0.3ML IM SUSY
PREFILLED_SYRINGE | INTRAMUSCULAR | 0 refills | Status: DC
  Filled 2022-10-17: qty 0.3, 1d supply, fill #0

## 2022-10-19 ENCOUNTER — Other Ambulatory Visit: Payer: Self-pay | Admitting: Family

## 2022-10-19 DIAGNOSIS — R928 Other abnormal and inconclusive findings on diagnostic imaging of breast: Secondary | ICD-10-CM

## 2022-10-25 ENCOUNTER — Ambulatory Visit: Payer: 59

## 2022-10-25 ENCOUNTER — Other Ambulatory Visit (HOSPITAL_BASED_OUTPATIENT_CLINIC_OR_DEPARTMENT_OTHER): Payer: Self-pay

## 2022-10-25 VITALS — Ht 67.5 in | Wt 181.0 lb

## 2022-10-25 DIAGNOSIS — Z1211 Encounter for screening for malignant neoplasm of colon: Secondary | ICD-10-CM

## 2022-10-25 MED ORDER — NA SULFATE-K SULFATE-MG SULF 17.5-3.13-1.6 GM/177ML PO SOLN
1.0000 | Freq: Once | ORAL | 0 refills | Status: AC
  Filled 2022-10-25: qty 354, 1d supply, fill #0

## 2022-10-25 NOTE — Progress Notes (Signed)
No egg or soy allergy known to patient  No issues known to pt with past sedation with any surgeries or procedures Patient denies ever being told they had issues or difficulty with intubation  No FH of Malignant Hyperthermia Pt is not on diet pills Pt is not on  home 02  Pt is not on blood thinners  Pt denies issues with constipation  No A fib or A flutter Have any cardiac testing pending--no Pt can ambulate independently Pt denies use of chewing tobacco Discussed diabetic I weight loss medication holds Discussed NSAID holds Checked BMI Pt instructed to use Singlecare.com or GoodRx for a price reduction on prep  Patient's chart reviewed by Cathlyn Parsons CNRA prior to previsit and patient appropriate for the [facility].  Pre visit completed and red dot placed by patient's name on their procedure day (on provider's schedule).   no

## 2022-10-31 ENCOUNTER — Ambulatory Visit
Admission: RE | Admit: 2022-10-31 | Discharge: 2022-10-31 | Disposition: A | Discharge status: Home / Self Care | Payer: 59 | Source: Ambulatory Visit | Attending: Family | Admitting: Family

## 2022-10-31 DIAGNOSIS — N6001 Solitary cyst of right breast: Secondary | ICD-10-CM | POA: Diagnosis not present

## 2022-10-31 DIAGNOSIS — R928 Other abnormal and inconclusive findings on diagnostic imaging of breast: Secondary | ICD-10-CM

## 2022-11-01 ENCOUNTER — Other Ambulatory Visit (HOSPITAL_BASED_OUTPATIENT_CLINIC_OR_DEPARTMENT_OTHER): Payer: Self-pay

## 2022-11-08 ENCOUNTER — Encounter: Payer: Self-pay | Admitting: Internal Medicine

## 2022-11-16 ENCOUNTER — Encounter: Payer: 59 | Admitting: Internal Medicine

## 2022-11-21 NOTE — Progress Notes (Unsigned)
[facility] History and Physical   Primary Care Physician:  Sandford Craze, NP   Reason for Procedure:   CRCA screening  Plan:    colonoscopy     HPI: Dominique Rice is a 62 y.o. female for repeat screening colonoscopy. 2014 colonoscopy w/o polyps, did show diverticulosis.   Past Medical History:  Diagnosis Date   Hypertension    Osteopenia    Pneumonia     Past Surgical History:  Procedure Laterality Date   ABDOMINAL HYSTERECTOMY     CESAREAN SECTION     twice   COLONOSCOPY  2014   ENDOMETRIAL ABLATION  2005   OOPHORECTOMY Left 1998   ORIF ANKLE FRACTURE Left 12/16/2018   Procedure: OPEN REDUCTION INTERNAL FIXATION (ORIF) Left ankle trimalleolar fracture;  Surgeon: Toni Arthurs, MD;  Location: [facility];  Service: Orthopedics;  Laterality: Left;    TUBAL LIGATION  1998    Prior to Admission medications   Medication Sig Start Date End Date Taking? Authorizing Provider  amLODipine (NORVASC) 5 MG tablet Take 1 tablet (5 mg total) by mouth daily. 09/30/22   Sandford Craze, NP  Calcium-Magnesium-Vitamin D (CALCIUM 500 PO) Take 1 tablet by mouth 2 (two) times daily.    [provider]  Cholecalciferol (VITAMIN D3) 2400 UNIT/ML LIQD by Does not apply route.    [provider]  clobetasol (TEMOVATE) 0.05 % external solution Apply 1 application topically 2 (two) times daily.    [provider]  COVID-19 mRNA vaccine [phone] (COMIRNATY) syringe Inject into the muscle. 01/03/22   Judyann Munson, MD  COVID-19 mRNA vaccine, Pfizer, (COMIRNATY) syringe Inject into the muscle. 10/17/22   Judyann Munson, MD  ketoconazole (NIZORAL) 2 % shampoo Apply 1 application topically 2 (two) times a week.    [provider]  Multiple Vitamin (MULTIVITAMIN) tablet Take 1 tablet by mouth daily.    [provider]    Current Outpatient Medications  Medication Sig Dispense Refill   amLODipine  (NORVASC) 5 MG tablet Take 1 tablet (5 mg total) by mouth daily. 90 tablet 0   Calcium-Magnesium-Vitamin D (CALCIUM 500 PO) Take 1 tablet by mouth 2 (two) times daily.     Cholecalciferol (VITAMIN D3) 2400 UNIT/ML LIQD by Does not apply route.     clobetasol (TEMOVATE) 0.05 % external solution Apply 1 application topically 2 (two) times daily.     COVID-19 mRNA vaccine 2023-2024 (COMIRNATY) syringe Inject into the muscle. 0.3 mL 0   COVID-19 mRNA vaccine, Pfizer, (COMIRNATY) syringe Inject into the muscle. 0.3 mL 0   ketoconazole (NIZORAL) 2 % shampoo Apply 1 application topically 2 (two) times a week.     Multiple Vitamin (MULTIVITAMIN) tablet Take 1 tablet by mouth daily.     No current facility-administered medications for this visit.    Allergies as of 11/22/2022 - Review Complete 10/25/2022  Allergen Reaction Noted   Amoxicillin-pot clavulanate  12/01/2021   Shrimp [shellfish allergy] Nausea And Vomiting 12/09/2010    Family History  Problem Relation Age of Onset   Cancer Mother 38       colon 2010   Diabetes Mother        type II   Colon cancer Mother    Macular degeneration Mother    Other Mother        trigeminal neuralgia   Atrial fibrillation Mother        died from cardiac causes   Coronary artery disease Father  67 had CABG   Atrial fibrillation Father    Hypertension Brother    Hypertension Brother    Hyperlipidemia Brother    Diabetes Mellitus II Maternal Grandfather    Atrial fibrillation Maternal Grandfather    Cancer Paternal Grandfather        unsure of cancer   Hodgkin's lymphoma Son    Esophageal cancer Neg Hx    Rectal cancer Neg Hx    Stomach cancer Neg Hx    Colon polyps Neg Hx     Social History   Socioeconomic History   Marital status: Married    Spouse name: Not on file   Number of children: Not on file   Years of education: Not on file   Highest education level: Not on file  Occupational History    Employer: [employer]   Tobacco Use   Smoking status: Never   Smokeless tobacco: Never  Vaping Use   Vaping status: Never Used  Substance and Sexual Activity   Alcohol use: Yes    Alcohol/week: 3.0 standard drinks of alcohol    Types: 3 Glasses of wine per week   Drug use: No   Sexual activity: Yes  Other Topics Concern   Not on file  Social History Narrative   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  Physical Exam: Vital signs There were no vitals taken for this visit.  General:   Alert,  Well-developed, well-nourished, pleasant and cooperative in NAD Lungs:  Clear throughout to auscultation.   Heart:  Regular rate and rhythm; no murmurs, clicks, rubs,  or gallops. Abdomen:  Soft, nontender and nondistended. Normal bowel sounds.   Neuro/Psych:  Alert and cooperative. Normal mood and affect. A and O x 3   @Dicie Edelen  Sena Slate, MD, [facility] [phone] (pager) 11/21/2022 8:28 PM@

## 2022-11-22 ENCOUNTER — Encounter: Payer: Self-pay | Admitting: Internal Medicine

## 2022-11-22 ENCOUNTER — Ambulatory Visit: Payer: 59 | Admitting: Internal Medicine

## 2022-11-22 ENCOUNTER — Other Ambulatory Visit: Payer: Self-pay | Admitting: Internal Medicine

## 2022-11-22 VITALS — BP 132/71 | HR 60 | Temp 97.4°F | Resp 13 | Ht 68.0 in | Wt 181.0 lb

## 2022-11-22 DIAGNOSIS — I10 Essential (primary) hypertension: Secondary | ICD-10-CM | POA: Diagnosis not present

## 2022-11-22 DIAGNOSIS — Z1211 Encounter for screening for malignant neoplasm of colon: Secondary | ICD-10-CM | POA: Diagnosis not present

## 2022-11-22 DIAGNOSIS — D122 Benign neoplasm of ascending colon: Secondary | ICD-10-CM

## 2022-11-22 DIAGNOSIS — K635 Polyp of colon: Secondary | ICD-10-CM | POA: Diagnosis not present

## 2022-11-22 MED ORDER — SODIUM CHLORIDE 0.9 % IV SOLN: 500.0000 mL | Freq: Once | INTRAVENOUS | Status: DC

## 2022-11-22 NOTE — Progress Notes (Signed)
Pt's states no medical or surgical changes since previsit or office visit. 

## 2022-11-22 NOTE — Progress Notes (Signed)
Called to room to assist during endoscopic procedure.  Patient ID and intended procedure confirmed with present staff. Received instructions for my participation in the procedure from the performing physician.  

## 2022-11-22 NOTE — Progress Notes (Signed)
Sedate, gd SR, tolerated procedure well, VSS, report to RN 

## 2022-11-22 NOTE — Patient Instructions (Addendum)
This time there was one very tiny polyp - removed. I will let you know pathology results and when to have another routine colonoscopy by mail and/or My Chart.  Also saw mild sigmoid diverticulosis.  I appreciate the opportunity to care for you. Iva Boop, MD, FACG   YOU HAD AN ENDOSCOPIC PROCEDURE TODAY AT THE [facility]:   Refer to the procedure report that was given to you for any specific questions about what was found during the examination.  If the procedure report does not answer your questions, please call your gastroenterologist to clarify.  If you requested that your care partner not be given the details of your procedure findings, then the procedure report has been included in a sealed envelope for you to review at your convenience later.  YOU SHOULD EXPECT: Some feelings of bloating in the abdomen. Passage of more gas than usual.  Walking can help get rid of the air that was put into your GI tract during the procedure and reduce the bloating. If you had a lower endoscopy (such as a colonoscopy or flexible sigmoidoscopy) you may notice spotting of blood in your stool or on the toilet paper. If you underwent a bowel prep for your procedure, you may not have a normal bowel movement for a few days.  Please Note:  You might notice some irritation and congestion in your nose or some drainage.  This is from the oxygen used during your procedure.  There is no need for concern and it should clear up in a day or so.  SYMPTOMS TO REPORT IMMEDIATELY:  Following lower endoscopy (colonoscopy or flexible sigmoidoscopy):  Excessive amounts of blood in the stool  Significant tenderness or worsening of abdominal pains  Swelling of the abdomen that is new, acute  Fever of 100F or higher  For urgent or emergent issues, a gastroenterologist can be reached at any hour by calling [phone]. Do not use MyChart messaging for urgent concerns.    DIET:  We do recommend a small  meal at first, but then you may proceed to your regular diet.  Drink plenty of fluids but you should avoid alcoholic beverages for 24 hours.  ACTIVITY:  You should plan to take it easy for the rest of today and you should NOT DRIVE or use heavy machinery until tomorrow (because of the sedation medicines used during the test).    FOLLOW UP: Our staff will call the number listed on your records the next business day following your procedure.  We will call around 7:15- 8:00 am to check on you and address any questions or concerns that you may have regarding the information given to you following your procedure. If we do not reach you, we will leave a message.     If any biopsies were taken you will be contacted by phone or by letter within the next 1-3 weeks.  Please call us at [phone] if you have not heard about the biopsies in 3 weeks.    SIGNATURES/CONFIDENTIALITY: You and/or your care partner have signed paperwork which will be entered into your electronic medical record.  These signatures attest to the fact that that the information above on your After Visit Summary has been reviewed and is understood.  Full responsibility of the confidentiality of this discharge information lies with you and/or your care-partner.

## 2022-11-22 NOTE — Op Note (Signed)
[facility] Patient Name: Dominique Rice Procedure Date: 11/22/2022 8:34 AM MRN: [id number] Endoscopist: Iva Boop , MD, [id number] Age: 62 Referring MD:  Date of Birth: 04-Apr-1960 Gender: Female Account #: [id number] Procedure:                Colonoscopy Indications:              Screening for colorectal malignant neoplasm, Last                            colonoscopy: 2014 Medicines:                Monitored Anesthesia Care Procedure:                Pre-Anesthesia Assessment:                           - Prior to the procedure, a History and Physical                            was performed, and patient medications and                            allergies were reviewed. The patient's tolerance of                            previous anesthesia was also reviewed. The risks                            and benefits of the procedure and the sedation                            options and risks were discussed with the patient.                            All questions were answered, and informed consent                            was obtained. Prior Anticoagulants: The patient has                            taken no anticoagulant or antiplatelet agents. ASA                            Grade Assessment: II - A patient with mild systemic                            disease. After reviewing the risks and benefits,                            the patient was deemed in satisfactory condition to                            undergo the procedure.  After obtaining informed consent, the colonoscope                            was passed under direct vision. Throughout the                            procedure, the patient's blood pressure, pulse, and                            oxygen saturations were monitored continuously. The                            Olympus Scope SN: [id number] was introduced through                            the anus and advanced to the the cecum,  identified                            by appendiceal orifice and ileocecal valve. The                            colonoscopy was performed without difficulty. The                            patient tolerated the procedure well. The quality                            of the bowel preparation was excellent. The                            ileocecal valve, appendiceal orifice, and rectum                            were photographed. The bowel preparation used was                            SUPREP via split dose instruction. Scope In: 8:43:36 AM Scope Out: 8:55:11 AM Scope Withdrawal Time: 0 hours 9 minutes 35 seconds  Total Procedure Duration: 0 hours 11 minutes 35 seconds  Findings:                 The perianal and digital rectal examinations were                            normal.                           A diminutive polyp was found in the ascending                            colon. The polyp was sessile. The polyp was removed                            with a cold snare. Resection and retrieval were  complete. Verification of patient identification                            for the specimen was done. Estimated blood loss was                            minimal.                           A few small-mouthed diverticula were found in the                            sigmoid colon.                           The exam was otherwise without abnormality on                            direct and retroflexion views. Complications:            No immediate complications. Estimated Blood Loss:     Estimated blood loss was minimal. Impression:               - One diminutive polyp in the ascending colon,                            removed with a cold snare. Resected and retrieved.                           - Mild diverticulosis in the sigmoid colon.                           - The examination was otherwise normal on direct                            and retroflexion  views. Recommendation:           - Patient has a contact number available for                            emergencies. The signs and symptoms of potential                            delayed complications were discussed with the                            patient. Return to normal activities tomorrow.                            Written discharge instructions were provided to the                            patient.                           - Resume previous diet.                           -  Continue present medications.                           - Repeat colonoscopy is recommended. The                            colonoscopy date will be determined after pathology                            results from today's exam become available for                            review. Iva Boop, MD 11/22/2022 8:59:37 AM This report has been signed electronically.

## 2022-11-23 ENCOUNTER — Telehealth: Payer: Self-pay | Admitting: *Deleted

## 2022-11-23 NOTE — Telephone Encounter (Signed)
  Follow up Call-     11/22/2022    7:51 AM  Call back number  Post procedure Call Back phone  # [phone]  Permission to leave phone message Yes     Patient questions:  Do you have a fever, pain , or abdominal swelling? No. Pain Score  0 *  Have you tolerated food without any problems? Yes.    Have you been able to return to your normal activities? Yes.    Do you have any questions about your discharge instructions: Diet   No. Medications  No. Follow up visit  No.  Do you have questions or concerns about your Care? No.  Actions: * If pain score is 4 or above: No action needed, pain <4.

## 2022-11-27 LAB — SURGICAL PATHOLOGY

## 2022-11-29 ENCOUNTER — Encounter: Payer: Self-pay | Admitting: Internal Medicine

## 2022-11-29 DIAGNOSIS — Z860100 Personal history of colon polyps, unspecified: Secondary | ICD-10-CM | POA: Insufficient documentation

## 2022-11-29 HISTORY — DX: Personal history of colon polyps, unspecified: Z86.0100

## 2022-12-30 ENCOUNTER — Other Ambulatory Visit: Payer: Self-pay | Admitting: Family

## 2022-12-31 ENCOUNTER — Other Ambulatory Visit (HOSPITAL_BASED_OUTPATIENT_CLINIC_OR_DEPARTMENT_OTHER): Payer: Self-pay

## 2022-12-31 MED ORDER — AMLODIPINE BESYLATE 5 MG PO TABS
5.0000 mg | ORAL_TABLET | Freq: Every day | ORAL | 0 refills | Status: DC
  Filled 2022-12-31: qty 90, 90d supply, fill #0

## 2023-01-02 ENCOUNTER — Other Ambulatory Visit (HOSPITAL_BASED_OUTPATIENT_CLINIC_OR_DEPARTMENT_OTHER): Payer: Self-pay

## 2023-01-02 ENCOUNTER — Ambulatory Visit (INDEPENDENT_AMBULATORY_CARE_PROVIDER_SITE_OTHER): Payer: Commercial Managed Care - PPO | Admitting: Family

## 2023-01-02 VITALS — BP 124/74 | HR 71 | Temp 98.2°F | Resp 16 | Wt 182.0 lb

## 2023-01-02 DIAGNOSIS — M8580 Other specified disorders of bone density and structure, unspecified site: Secondary | ICD-10-CM | POA: Diagnosis not present

## 2023-01-02 DIAGNOSIS — I10 Essential (primary) hypertension: Secondary | ICD-10-CM

## 2023-01-02 DIAGNOSIS — Z860100 Personal history of colon polyps, unspecified: Secondary | ICD-10-CM

## 2023-01-02 MED ORDER — AMLODIPINE BESYLATE 5 MG PO TABS
5.0000 mg | ORAL_TABLET | Freq: Every day | ORAL | 0 refills | Status: DC
  Filled 2023-01-02: qty 90, 90d supply, fill #0

## 2023-01-02 NOTE — Assessment & Plan Note (Signed)
We discussed that her frax score is borderline for initiation of bisphosphonates. She wishes to continue calcium supplementation and repeat dexa in 2 years.

## 2023-01-02 NOTE — Assessment & Plan Note (Signed)
Plan for repeat colo in 2031.

## 2023-01-02 NOTE — Assessment & Plan Note (Signed)
BP at goal, continue amlodipine '5mg'$ .

## 2023-01-02 NOTE — Progress Notes (Signed)
Subjective:     Patient ID: Dominique Rice, female    DOB: 09-Jul-1960, 62 y.o.   MRN: [id number]  Chief Complaint  Patient presents with   Hypertension    Here for follow up    Hypertension    Discussed the use of AI scribe software for clinical note transcription with the patient, who gave verbal consent to proceed.  History of Present Illness   The patient, on amlodipine 5mg  for blood pressure control, reports no side effects such as feet swelling. Blood pressure is well controlled at 124/74. The patient has a history of osteopenia, with a recent bone density scan showing some osteopenia. The FRAX score is in an intermediate zone, with total risk of fracture number over the threshold and hip fracture risk under. The patient admits to not always taking the second dose of calcium. Preventive care is up to date, with a recent mammogram and colonoscopy. The patient has also received the flu shot and COVID vaccine. 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           Health Maintenance Due  Topic Date Due   COVID-19 Vaccine (8 - 2024-25 season) 12/28/2022    Past Medical History:  Diagnosis Date   Hx of sessile serrated colonic polyp 11/29/2022   Hypertension    Osteopenia    Pneumonia     Past Surgical History:  Procedure Laterality Date   ABDOMINAL HYSTERECTOMY     CESAREAN SECTION     twice   COLONOSCOPY  2014   ENDOMETRIAL ABLATION  2005   OOPHORECTOMY Left 1998   ORIF ANKLE FRACTURE Left 12/16/2018   Procedure: OPEN REDUCTION INTERNAL FIXATION (ORIF) Left ankle trimalleolar fracture;  Surgeon: Toni Arthurs, MD;  Location: [facility];  Service: Orthopedics;  Laterality: Left;    TUBAL LIGATION  1998    Family History  Problem Relation Age of Onset   Cancer Mother 68       colon 2010   Diabetes Mother        type II   Colon cancer Mother    Macular degeneration Mother    Other Mother         trigeminal neuralgia   Atrial fibrillation Mother        died from cardiac causes   Coronary artery disease Father        64 had CABG   Atrial fibrillation Father    Hypertension Brother    Hypertension Brother    Hyperlipidemia Brother    Diabetes Mellitus II Maternal Grandfather    Atrial fibrillation Maternal Grandfather    Cancer Paternal Grandfather        unsure of cancer   Hodgkin's lymphoma Son    Esophageal cancer Neg Hx    Rectal cancer Neg Hx    Stomach cancer Neg Hx    Colon polyps Neg Hx     Social History   Socioeconomic History   Marital status: Married    Spouse name: Not on file   Number of children: Not on file   Years of education: Not on file   Highest education level: Associate degree: occupational, Scientist, product/process development, or vocational program  Occupational History    Employer:   Tobacco Use   Smoking status: Never   Smokeless tobacco: Never  Vaping Use   Vaping status: Never Used  Substance and Sexual Activity   Alcohol use: Yes  Alcohol/week: 3.0 standard drinks of alcohol    Types: 3 Glasses of wine per week   Drug use: No   Sexual activity: Yes  Other Topics Concern   Not on file  Social History Narrative   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   Married    1995- Luisa Hart   1998- Barbara Cower   Enjoys gardening, sporting events      Social Drivers of Corporate investment banker Strain: Low Risk  (01/01/2023)   Overall Financial Resource Strain (CARDIA)    Difficulty of Paying Living Expenses: Not hard at all  Food Insecurity: No Food Insecurity (01/01/2023)   Hunger Vital Sign    Worried About Running Out of Food in the Last Year: Never true    Ran Out of Food in the Last Year: Never true  Transportation Needs: No Transportation Needs (01/01/2023)   PRAPARE - Administrator, Civil Service (Medical): No    Lack of Transportation (Non-Medical): No  Physical Activity: Sufficiently Active (01/01/2023)   Exercise Vital Sign     Days of Exercise per Week: 4 days    Minutes of Exercise per Session: 50 min  Stress: No Stress Concern Present (01/01/2023)   [employer] of Occupational Health - Occupational Stress Questionnaire    Feeling of Stress : Only a little  Social Connections: Socially Integrated (01/01/2023)   Social Connection and Isolation Panel [NHANES]    Frequency of Communication with Friends and Family: More than three times a week    Frequency of Social Gatherings with Friends and Family: Once a week    Attends Religious Services: More than 4 times per year    Active Member of [employer] or Organizations: Yes    Attends Banker Meetings: 1 to 4 times per year    Marital Status: Married  Catering manager Violence: Not on file    Outpatient Medications Prior to Visit  Medication Sig Dispense Refill   Calcium-Magnesium-Vitamin D (CALCIUM 500 PO) Take 1 tablet by mouth 2 (two) times daily.     Cholecalciferol (VITAMIN D3) 2400 UNIT/ML LIQD by Does not apply route.     clobetasol (TEMOVATE) 0.05 % external solution Apply 1 application topically 2 (two) times daily.     ketoconazole (NIZORAL) 2 % shampoo Apply 1 application topically 2 (two) times a week.     Multiple Vitamin (MULTIVITAMIN) tablet Take 1 tablet by mouth daily.     amLODipine (NORVASC) 5 MG tablet Take 1 tablet (5 mg total) by mouth daily. 90 tablet 0   No facility-administered medications prior to visit.    Allergies  Allergen Reactions   Amoxicillin-Pot Clavulanate    Shrimp [Shellfish Allergy] Nausea And Vomiting    09/29/12  Pt states she can tolerate betadine and IV dyes. Tf,cma(aama)    ROS See HPI    Objective:    Physical Exam Constitutional:      General: She is not in acute distress.    Appearance: Normal appearance. She is well-developed.  HENT:     Head: Normocephalic and atraumatic.     Right Ear: External ear normal.     Left Ear: External ear normal.  Eyes:     General: No scleral  icterus. Neck:     Thyroid: No thyromegaly.  Cardiovascular:     Rate and Rhythm: Normal rate and regular rhythm.     Heart sounds: Normal heart sounds. No murmur heard. Pulmonary:     Effort: Pulmonary effort  is normal. No respiratory distress.     Breath sounds: Normal breath sounds. No wheezing.  Musculoskeletal:     Cervical back: Neck supple.  Skin:    General: Skin is warm and dry.  Neurological:     Mental Status: She is alert and oriented to person, place, and time.  Psychiatric:        Mood and Affect: Mood normal.        Behavior: Behavior normal.        Thought Content: Thought content normal.        Judgment: Judgment normal.      BP 124/74 (BP Location: Right Arm, Patient Position: Sitting, Cuff Size: Small)   Pulse 71   Temp 98.2 F (36.8 C) (Oral)   Resp 16   Wt 182 lb (82.6 kg)   SpO2 100%   BMI 27.67 kg/m  Wt Readings from Last 3 Encounters:  01/02/23 182 lb (82.6 kg)  11/22/22 181 lb (82.1 kg)  10/25/22 181 lb (82.1 kg)       Assessment & Plan:   Problem List Items Addressed This Visit       Unprioritized   Osteopenia   We discussed that her frax score is borderline for initiation of bisphosphonates. She wishes to continue calcium supplementation and repeat dexa in 2 years.       Hypertension - Primary   BP at goal, continue amlodipine 5mg .        Relevant Medications   amLODipine (NORVASC) 5 MG tablet   Hx of sessile serrated colonic polyp   Plan for repeat colo in 2031.        I am having Dominique Rice maintain her multivitamin, Calcium-Magnesium-Vitamin D (CALCIUM 500 PO), clobetasol, ketoconazole, Vitamin D3, and amLODipine.  Meds ordered this encounter  Medications   amLODipine (NORVASC) 5 MG tablet    Sig: Take 1 tablet (5 mg total) by mouth daily.    Dispense:  90 tablet    Refill:  0    Supervising Provider:   Danise Edge A [[id number]]

## 2023-01-02 NOTE — Patient Instructions (Signed)
VISIT SUMMARY:  During today's visit, we reviewed your current health status and discussed your ongoing treatments and preventive care. Your blood pressure is well controlled with your current medication, and we also talked about your osteopenia and preventive care measures.  YOUR PLAN:  -HYPERTENSION: Hypertension, or high blood pressure, is when the force of the blood against your artery walls is too high. Your blood pressure is well controlled with Amlodipine 5mg  daily, and you should continue this medication as prescribed.  -OSTEOPENIA: Osteopenia is a condition where bone mineral density is lower than normal, which can be a precursor to osteoporosis. Your FRAX score is in a gray zone for starting medication, so we will continue with calcium supplements and weight-bearing exercises for now. Please remember to take your calcium as directed. We will repeat your bone density scan in two years.  -PREVENTIVE CARE: Your preventive care is up to date, including your mammogram, colonoscopy, flu shot, and COVID vaccine. Continue with your current preventive care measures, and your next colonoscopy is planned for 2031.  INSTRUCTIONS:  Please follow up in one year for routine labs.

## 2023-02-27 DIAGNOSIS — H524 Presbyopia: Secondary | ICD-10-CM | POA: Diagnosis not present

## 2023-02-27 DIAGNOSIS — Z135 Encounter for screening for eye and ear disorders: Secondary | ICD-10-CM | POA: Diagnosis not present

## 2023-03-30 ENCOUNTER — Other Ambulatory Visit: Payer: Self-pay | Admitting: Family

## 2023-03-30 MED ORDER — AMLODIPINE BESYLATE 5 MG PO TABS
5.0000 mg | ORAL_TABLET | Freq: Every day | ORAL | 0 refills | Status: DC
  Filled 2023-03-30: qty 90, 90d supply, fill #0

## 2023-04-01 ENCOUNTER — Other Ambulatory Visit (HOSPITAL_BASED_OUTPATIENT_CLINIC_OR_DEPARTMENT_OTHER): Payer: Self-pay

## 2023-06-24 ENCOUNTER — Other Ambulatory Visit: Payer: Self-pay | Admitting: Family

## 2023-07-02 ENCOUNTER — Other Ambulatory Visit (HOSPITAL_BASED_OUTPATIENT_CLINIC_OR_DEPARTMENT_OTHER): Payer: Self-pay

## 2023-07-03 ENCOUNTER — Other Ambulatory Visit (HOSPITAL_BASED_OUTPATIENT_CLINIC_OR_DEPARTMENT_OTHER): Payer: Self-pay

## 2023-07-03 ENCOUNTER — Ambulatory Visit: Payer: 59 | Admitting: Family

## 2023-07-03 VITALS — BP 133/63 | HR 64 | Temp 98.0°F | Resp 16 | Ht 68.0 in | Wt 186.0 lb

## 2023-07-03 DIAGNOSIS — M8580 Other specified disorders of bone density and structure, unspecified site: Secondary | ICD-10-CM

## 2023-07-03 DIAGNOSIS — E785 Hyperlipidemia, unspecified: Secondary | ICD-10-CM | POA: Diagnosis not present

## 2023-07-03 DIAGNOSIS — I10 Essential (primary) hypertension: Secondary | ICD-10-CM

## 2023-07-03 LAB — COMPREHENSIVE METABOLIC PANEL WITH GFR
ALT: 16 U/L (ref 0–35)
AST: 18 U/L (ref 0–37)
Albumin: 4.3 g/dL (ref 3.5–5.2)
Alkaline Phosphatase: 78 U/L (ref 39–117)
BUN: 18 mg/dL (ref 6–23)
CO2: 31 meq/L (ref 19–32)
Calcium: 9.5 mg/dL (ref 8.4–10.5)
Chloride: 103 meq/L (ref 96–112)
Creatinine, Ser: 0.73 mg/dL (ref 0.40–1.20)
GFR: 87.93 mL/min (ref >60.00)
Glucose, Bld: 104 mg/dL — ABNORMAL HIGH (ref 70–99)
Potassium: 5.5 meq/L — ABNORMAL HIGH (ref 3.5–5.1)
Sodium: 138 meq/L (ref 135–145)
Total Bilirubin: 0.5 mg/dL (ref 0.2–1.2)
Total Protein: 7.2 g/dL (ref 6.0–8.3)

## 2023-07-03 LAB — LIPID PANEL
Cholesterol: 214 mg/dL — ABNORMAL HIGH (ref 0–200)
HDL: 64.5 mg/dL (ref >39.00)
LDL Cholesterol: 134 mg/dL — ABNORMAL HIGH (ref 0–99)
NonHDL: 149.74
Total CHOL/HDL Ratio: 3
Triglycerides: 80 mg/dL (ref 0.0–149.0)
VLDL: 16 mg/dL (ref 0.0–40.0)

## 2023-07-03 MED ORDER — AMLODIPINE BESYLATE 5 MG PO TABS
5.0000 mg | ORAL_TABLET | Freq: Every day | ORAL | 1 refills | Status: DC
  Filled 2023-07-03: qty 90, 90d supply, fill #0
  Filled 2023-09-22: qty 90, 90d supply, fill #1

## 2023-07-03 NOTE — Assessment & Plan Note (Signed)
 Continues caltrate, walking, plan to repeat dexa in 2 years.

## 2023-07-03 NOTE — Progress Notes (Signed)
 Subjective:     Patient ID: Dominique Rice, female    DOB: 1960/08/06, 63 y.o.   MRN: [id number]  Chief Complaint  Patient presents with   Hypertension    Here for follow up    Hypertension    Discussed the use of AI scribe software for clinical note transcription with the patient, who gave verbal consent to proceed.  History of Present Illness  Dominique Rice is a 63 year old female with hypertension who presents for a follow-up on her medications and blood pressure management.  Her blood pressure is 133/63 mmHg, and she takes amlodipine  5 mg daily without experiencing pedal edema. She reports no other symptoms related to hypertension.  She maintains her general health with up-to-date colonoscopy and immunizations. Her mammogram is due in October. She takes a calcium supplement for borderline bone density.  She engages in physical activity, including walking. She continues to work and is considering reducing her workdays to two per week. Her husband retired early due to job stress but remains active at home.     Health Maintenance Due  Topic Date Due   COVID-19 Vaccine (8 - Pfizer risk 2024-25 season) 05/03/2023    Past Medical History:  Diagnosis Date   Hx of sessile serrated colonic polyp 11/29/2022   Hypertension    Osteopenia    Pneumonia     Past Surgical History:  Procedure Laterality Date   ABDOMINAL HYSTERECTOMY     CESAREAN SECTION     twice   COLONOSCOPY  2014   ENDOMETRIAL ABLATION  2005   OOPHORECTOMY Left 1998   ORIF ANKLE FRACTURE Left 12/16/2018   Procedure: OPEN REDUCTION INTERNAL FIXATION (ORIF) Left ankle trimalleolar fracture;  Surgeon: Amada Backer, MD;  Location: [facility];  Service: Orthopedics;  Laterality: Left;    TUBAL LIGATION  1998    Family History  Problem Relation Age of Onset   Cancer Mother 73       colon 2010   Diabetes Mother        type II   Colon cancer Mother    Macular  degeneration Mother    Other Mother        trigeminal neuralgia   Atrial fibrillation Mother        died from cardiac causes   Coronary artery disease Father        1 had CABG   Atrial fibrillation Father    Hypertension Brother    Hypertension Brother    Hyperlipidemia Brother    Diabetes Mellitus II Maternal Grandfather    Atrial fibrillation Maternal Grandfather    Cancer Paternal Grandfather        unsure of cancer   Hodgkin's lymphoma Son    Esophageal cancer Neg Hx    Rectal cancer Neg Hx    Stomach cancer Neg Hx    Colon polyps Neg Hx     Social History   Socioeconomic History   Marital status: Married    Spouse name: Not on file   Number of children: Not on file   Years of education: Not on file   Highest education level: Bachelor's degree (e.g., BA, AB, BS)  Occupational History    Employer: [employer]  Tobacco Use   Smoking status: Never   Smokeless tobacco: Never  Vaping Use   Vaping status: Never Used  Substance and Sexual Activity   Alcohol use: Yes    Alcohol/week: 3.0 standard drinks of alcohol  Types: 3 Glasses of wine per week   Drug use: No   Sexual activity: Yes  Other Topics Concern   Not on file  Social History Narrative   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   Married    1995- Portia Brittle   1998- Reymundo Caulk   Enjoys gardening, sporting events      Social Drivers of Corporate investment banker Strain: Low Risk  (07/02/2023)   Overall Financial Resource Strain (CARDIA)    Difficulty of Paying Living Expenses: Not hard at all  Food Insecurity: No Food Insecurity (07/02/2023)   Hunger Vital Sign    Worried About Running Out of Food in the Last Year: Never true    Ran Out of Food in the Last Year: Never true  Transportation Needs: No Transportation Needs (07/02/2023)   PRAPARE - Administrator, Civil Service (Medical): No    Lack of Transportation (Non-Medical): No  Physical Activity: Sufficiently Active (07/02/2023)   Exercise Vital Sign     Days of Exercise per Week: 4 days    Minutes of Exercise per Session: 40 min  Stress: No Stress Concern Present (07/02/2023)   [employer] of Occupational Health - Occupational Stress Questionnaire    Feeling of Stress: Only a little  Social Connections: Socially Integrated (07/02/2023)   Social Connection and Isolation Panel    Frequency of Communication with Friends and Family: More than three times a week    Frequency of Social Gatherings with Friends and Family: Twice a week    Attends Religious Services: More than 4 times per year    Active Member of [employer] or Organizations: Yes    Attends Engineer, structural: More than 4 times per year    Marital Status: Married  Catering manager Violence: Not on file    Outpatient Medications Prior to Visit  Medication Sig Dispense Refill   Calcium-Magnesium-Vitamin D (CALCIUM 500 PO) Take 1 tablet by mouth 2 (two) times daily.     Cholecalciferol (VITAMIN D3) 2400 UNIT/ML LIQD by Does not apply route.     clobetasol (TEMOVATE) 0.05 % external solution Apply 1 application topically 2 (two) times daily.     ketoconazole (NIZORAL) 2 % shampoo Apply 1 application topically 2 (two) times a week.     Multiple Vitamin (MULTIVITAMIN) tablet Take 1 tablet by mouth daily.     amLODipine  (NORVASC ) 5 MG tablet Take 1 tablet (5 mg total) by mouth daily. 90 tablet 0   No facility-administered medications prior to visit.    Allergies  Allergen Reactions   Amoxicillin -Pot Clavulanate    Shrimp [Shellfish Allergy] Nausea And Vomiting    09/29/12  Pt states she can tolerate betadine and IV dyes. Tf,cma(aama)    ROS See HPI    Objective:    Physical Exam Constitutional:      General: She is not in acute distress.    Appearance: Normal appearance. She is well-developed.  HENT:     Head: Normocephalic and atraumatic.     Right Ear: External ear normal.     Left Ear: External ear normal.   Eyes:     General: No scleral  icterus.  Neck:     Thyroid : No thyromegaly.   Cardiovascular:     Rate and Rhythm: Normal rate and regular rhythm.     Heart sounds: Normal heart sounds. No murmur heard. Pulmonary:     Effort: Pulmonary effort is normal. No respiratory distress.  Breath sounds: Normal breath sounds. No wheezing.   Musculoskeletal:     Cervical back: Neck supple.   Skin:    General: Skin is warm and dry.   Neurological:     Mental Status: She is alert and oriented to person, place, and time.   Psychiatric:        Mood and Affect: Mood normal.        Behavior: Behavior normal.        Thought Content: Thought content normal.        Judgment: Judgment normal.      BP 133/63 (BP Location: Right Arm, Patient Position: Sitting, Cuff Size: Normal)   Pulse 64   Temp 98 F (36.7 C) (Oral)   Resp 16   Ht 5' 8 (1.727 m)   Wt 186 lb (84.4 kg)   SpO2 99%   BMI 28.28 kg/m  Wt Readings from Last 3 Encounters:  07/03/23 186 lb (84.4 kg)  01/02/23 182 lb (82.6 kg)  11/22/22 181 lb (82.1 kg)       Assessment & Plan:   Problem List Items Addressed This Visit       Unprioritized   Osteopenia   Continues caltrate, walking, plan to repeat dexa in 2 years.       Hypertension - Primary   BP stable on amlodipine , continue same.      Relevant Medications   amLODipine  (NORVASC ) 5 MG tablet   Other Relevant Orders   Comp Met (CMET)   Hyperlipidemia   Lab Results  Component Value Date   CHOL 189 07/10/2022   HDL 57.90 07/10/2022   LDLCALC 102 (H) 07/10/2022   TRIG 147.0 07/10/2022   CHOLHDL 3 07/10/2022   Has been high in the past but looked better last year. Update lipid panel.       Relevant Medications   amLODipine  (NORVASC ) 5 MG tablet   Other Relevant Orders   Lipid panel    I am having Alexandra Ang maintain her multivitamin, Calcium-Magnesium-Vitamin D (CALCIUM 500 PO), clobetasol, ketoconazole, Vitamin D3, and amLODipine .  Meds ordered this encounter   Medications   amLODipine  (NORVASC ) 5 MG tablet    Sig: Take 1 tablet (5 mg total) by mouth daily.    Dispense:  90 tablet    Refill:  1    Supervising Provider:   Randie Bustle A [[id number]]

## 2023-07-03 NOTE — Assessment & Plan Note (Signed)
BP stable on amlodipine, continue same.  

## 2023-07-03 NOTE — Patient Instructions (Signed)
 VISIT SUMMARY:  Today, we reviewed your blood pressure management and overall health. Your blood pressure is well-controlled, and we discussed maintaining your bone health and general wellness.  YOUR PLAN:  HYPERTENSION: Your blood pressure is well-controlled at 133/63 mmHg with your current medication. -Continue taking amlodipine  5 mg daily.  OSTEOPENIA: Your bone density is borderline, and you are taking steps to maintain it. -Continue taking your calcium supplement. -Engage in regular weight-bearing exercises like walking. -We will repeat your bone density scan in two years.  GENERAL HEALTH MAINTENANCE: Your general health is good, with up-to-date colonoscopy and immunizations. Your mammogram is due in October. -Schedule your mammogram in October. -Consider starting yoga for joint flexibility and overall well-being.  ROUTINE FOLLOW-UP: We need to monitor your overall health with regular check-ups. -Schedule a follow-up appointment in six months for a physical examination. -We will order kidney function and cholesterol tests at your next visit.

## 2023-07-03 NOTE — Assessment & Plan Note (Signed)
 Lab Results  Component Value Date   CHOL 189 07/10/2022   HDL 57.90 07/10/2022   LDLCALC 102 (H) 07/10/2022   TRIG 147.0 07/10/2022   CHOLHDL 3 07/10/2022   Has been high in the past but looked better last year. Update lipid panel.

## 2023-07-04 ENCOUNTER — Telehealth: Payer: Self-pay | Admitting: Family

## 2023-07-04 DIAGNOSIS — E875 Hyperkalemia: Secondary | ICD-10-CM

## 2023-07-04 NOTE — Telephone Encounter (Signed)
 Patient notified of results and provider's comments. She was scheduled to come in 07/11/23 for labs

## 2023-07-04 NOTE — Telephone Encounter (Signed)
 Cholesterol elevated. Please continue to work on exercise and low cholesterol diet.  Potassium high- let's repeat bmet in 1 week, dx hyperkalemia.

## 2023-07-11 ENCOUNTER — Other Ambulatory Visit (INDEPENDENT_AMBULATORY_CARE_PROVIDER_SITE_OTHER)

## 2023-07-11 ENCOUNTER — Ambulatory Visit: Payer: Self-pay | Admitting: Family

## 2023-07-11 DIAGNOSIS — E875 Hyperkalemia: Secondary | ICD-10-CM

## 2023-07-11 LAB — BASIC METABOLIC PANEL WITH GFR
BUN: 15 mg/dL (ref 6–23)
CO2: 31 meq/L (ref 19–32)
Calcium: 9.2 mg/dL (ref 8.4–10.5)
Chloride: 101 meq/L (ref 96–112)
Creatinine, Ser: 0.69 mg/dL (ref 0.40–1.20)
GFR: 92.78 mL/min (ref >60.00)
Glucose, Bld: 118 mg/dL — ABNORMAL HIGH (ref 70–99)
Potassium: 4.8 meq/L (ref 3.5–5.1)
Sodium: 137 meq/L (ref 135–145)

## 2023-10-03 ENCOUNTER — Other Ambulatory Visit: Payer: Self-pay | Admitting: Family

## 2023-10-03 DIAGNOSIS — Z1231 Encounter for screening mammogram for malignant neoplasm of breast: Secondary | ICD-10-CM

## 2023-11-04 ENCOUNTER — Ambulatory Visit
Admission: RE | Admit: 2023-11-04 | Discharge: 2023-11-04 | Disposition: A | Discharge status: Home / Self Care | Source: Ambulatory Visit | Attending: Family | Admitting: Family

## 2023-11-04 ENCOUNTER — Encounter: Payer: Self-pay | Admitting: Family

## 2023-11-04 DIAGNOSIS — Z1231 Encounter for screening mammogram for malignant neoplasm of breast: Secondary | ICD-10-CM | POA: Diagnosis not present

## 2023-11-07 ENCOUNTER — Ambulatory Visit: Payer: Self-pay | Admitting: Family

## 2023-11-08 ENCOUNTER — Other Ambulatory Visit (HOSPITAL_BASED_OUTPATIENT_CLINIC_OR_DEPARTMENT_OTHER): Payer: Self-pay

## 2023-11-08 MED ORDER — COMIRNATY 30 MCG/0.3ML IM SUSY
0.3000 mL | PREFILLED_SYRINGE | Freq: Once | INTRAMUSCULAR | 0 refills | Status: AC
  Filled 2023-11-08: qty 0.3, 1d supply, fill #0

## 2023-12-15 ENCOUNTER — Encounter (HOSPITAL_BASED_OUTPATIENT_CLINIC_OR_DEPARTMENT_OTHER): Payer: Self-pay

## 2023-12-16 ENCOUNTER — Other Ambulatory Visit: Payer: Self-pay | Admitting: Family

## 2023-12-16 ENCOUNTER — Other Ambulatory Visit (HOSPITAL_BASED_OUTPATIENT_CLINIC_OR_DEPARTMENT_OTHER): Payer: Self-pay

## 2023-12-16 ENCOUNTER — Other Ambulatory Visit: Payer: Self-pay

## 2023-12-16 DIAGNOSIS — I10 Essential (primary) hypertension: Secondary | ICD-10-CM

## 2023-12-16 MED ORDER — AMLODIPINE BESYLATE 5 MG PO TABS
5.0000 mg | ORAL_TABLET | Freq: Every day | ORAL | 1 refills | Status: AC
  Filled 2023-12-16: qty 90, 90d supply, fill #0

## 2024-01-01 ENCOUNTER — Encounter: Admitting: Family

## 2024-01-31 ENCOUNTER — Encounter: Payer: Self-pay | Admitting: Family

## 2024-01-31 ENCOUNTER — Ambulatory Visit: Admitting: Family

## 2024-01-31 VITALS — BP 127/66 | HR 70 | Temp 97.8°F | Resp 16 | Ht 68.0 in | Wt 186.0 lb

## 2024-01-31 DIAGNOSIS — Z23 Encounter for immunization: Secondary | ICD-10-CM

## 2024-01-31 DIAGNOSIS — Z0000 Encounter for general adult medical examination without abnormal findings: Secondary | ICD-10-CM | POA: Diagnosis not present

## 2024-01-31 DIAGNOSIS — I10 Essential (primary) hypertension: Secondary | ICD-10-CM

## 2024-01-31 DIAGNOSIS — E785 Hyperlipidemia, unspecified: Secondary | ICD-10-CM | POA: Diagnosis not present

## 2024-01-31 DIAGNOSIS — M8580 Other specified disorders of bone density and structure, unspecified site: Secondary | ICD-10-CM | POA: Diagnosis not present

## 2024-01-31 NOTE — Assessment & Plan Note (Signed)
 BP stable on amlodipine 5 mg.

## 2024-01-31 NOTE — Progress Notes (Unsigned)
 "  Subjective:     Patient ID: Dominique Rice, female    DOB: August 06, 1960, 64 y.o.   MRN: [id number]  No chief complaint on file.   HPI  Discussed the use of AI scribe software for clinical note transcription with the patient, who gave verbal consent to proceed.  History of Present Illness Dominique Rice is a 64 year old female who presents for an annual physical exam.  She has no specific concerns at this time and her blood pressure management is going well. She is currently taking amlodipine  5 mg daily for hypertension.  She experiences some discomfort in her right shoulder, which she is monitoring. No cough, cold symptoms, skin concerns, hearing or vision issues, leg swelling, constipation, diarrhea, urinary symptoms, or unusual muscle or joint pain.  She maintains a healthy diet and exercise routine, although she acknowledges she could exercise more. She takes calcium supplements, specifically a [employer] brand similar to Caltrate, aiming for two tablets a day but sometimes forgets the second dose. She also takes vitamin D supplements.  Her past medical history includes a hysterectomy. She confirms she is up to date with her vision and dental check-ups. She had a mammogram in October and a bone density scan in October 2024, which showed mild osteopenia.  Her family history includes her father having heart disease and undergoing bypass surgery in his sixties. He was not a smoker but worked in an environment where others smoked.  She recalls a past incident during pregnancy when her cholesterol was unusually high at 450 mg/dL, which returned to normal post-pregnancy.   Immunizations: prevnar 20 today Diet: healthy Exercise: healthy Colonoscopy: 2024 due 2031 [id number] Pap Smear: hysterectomy Mammogram: up to date Vision: up to date Dental: up to date    There are no preventive care reminders to display for this patient.   Past Medical History:  Diagnosis Date    Hx of sessile serrated colonic polyp 11/29/2022   Hypertension    Osteopenia    Pneumonia     Past Surgical History:  Procedure Laterality Date   ABDOMINAL HYSTERECTOMY     CESAREAN SECTION     twice   COLONOSCOPY  2014   ENDOMETRIAL ABLATION  2005   OOPHORECTOMY Left 1998   ORIF ANKLE FRACTURE Left 12/16/2018   Procedure: OPEN REDUCTION INTERNAL FIXATION (ORIF) Left ankle trimalleolar fracture;  Surgeon: Kit Rush, MD;  Location: [facility];  Service: Orthopedics;  Laterality: Left;    TUBAL LIGATION  1998    Family History  Problem Relation Age of Onset   Cancer Mother 65       colon 2010   Diabetes Mother        type II   Colon cancer Mother    Macular degeneration Mother    Other Mother        trigeminal neuralgia   Atrial fibrillation Mother        died from cardiac causes   Coronary artery disease Father        3 had CABG   Atrial fibrillation Father    Hypertension Brother    Hypertension Brother    Hyperlipidemia Brother    Diabetes Mellitus II Maternal Grandfather    Atrial fibrillation Maternal Grandfather    Cancer Paternal Grandfather        unsure of cancer   Hodgkin's lymphoma Son    Esophageal cancer Neg Hx    Rectal cancer Neg Hx    Stomach  cancer Neg Hx    Colon polyps Neg Hx     Social History   Socioeconomic History   Marital status: Married    Spouse name: Not on file   Number of children: Not on file   Years of education: Not on file   Highest education level: Associate degree: academic program  Occupational History    Employer: [employer]  Tobacco Use   Smoking status: Never   Smokeless tobacco: Never  Vaping Use   Vaping status: Never Used  Substance and Sexual Activity   Alcohol use: Yes    Alcohol/week: 3.0 standard drinks of alcohol    Types: 3 Glasses of wine per week   Drug use: No   Sexual activity: Yes  Other Topics Concern   Not on file  Social History Narrative   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   Married    1995- Belvie   1998- Selinda   Enjoys gardening, sporting events      Social Drivers of Health   Tobacco Use: Low Risk (01/31/2024)   Patient History    Smoking Tobacco Use: Never    Smokeless Tobacco Use: Never    Passive Exposure: Not on file  Financial Resource Strain: Low Risk (01/30/2024)   Overall Financial Resource Strain (CARDIA)    Difficulty of Paying Living Expenses: Not hard at all  Food Insecurity: No Food Insecurity (01/30/2024)   Epic    Worried About Programme Researcher, Broadcasting/film/video in the Last Year: Never true    Ran Out of Food in the Last Year: Never true  Transportation Needs: No Transportation Needs (01/30/2024)   Epic    Lack of Transportation (Medical): No    Lack of Transportation (Non-Medical): No  Physical Activity: Sufficiently Active (01/30/2024)   Exercise Vital Sign    Days of Exercise per Week: 3 days    Minutes of Exercise per Session: 60 min  Stress: No Stress Concern Present (01/30/2024)   [employer] of Occupational Health - Occupational Stress Questionnaire    Feeling of Stress: Not at all  Social Connections: Socially Integrated (01/30/2024)   Social Connection and Isolation Panel    Frequency of Communication with Friends and Family: Three times a week    Frequency of Social Gatherings with Friends and Family: Once a week    Attends Religious Services: More than 4 times per year    Active Member of Clubs or Organizations: Yes    Attends Engineer, Structural: More than 4 times per year    Marital Status: Married  Catering Manager Violence: Not on file  Depression (PHQ2-9): Low Risk (01/31/2024)   Depression (PHQ2-9)    PHQ-2 Score: 1  Alcohol Screen: Low Risk (01/30/2024)   Alcohol Screen    Last Alcohol Screening Score (AUDIT): 2  Housing: Low Risk (01/30/2024)   Epic    Unable to Pay for Housing in the Last Year: No    Number of Times Moved in the Last Year: 0    Homeless in the Last Year: No  Utilities: Not on file   Health Literacy: Not on file    Outpatient Medications Prior to Visit  Medication Sig Dispense Refill   amLODipine  (NORVASC ) 5 MG tablet Take 1 tablet (5 mg total) by mouth daily. 90 tablet 1   Calcium-Magnesium-Vitamin D (CALCIUM 500 PO) Take 1 tablet by mouth 2 (two) times daily.     Cholecalciferol (VITAMIN D3) 2400 UNIT/ML LIQD by Does not apply  route.     clobetasol (TEMOVATE) 0.05 % external solution Apply 1 application topically 2 (two) times daily.     ketoconazole (NIZORAL) 2 % shampoo Apply 1 application topically 2 (two) times a week.     Multiple Vitamin (MULTIVITAMIN) tablet Take 1 tablet by mouth daily.     No facility-administered medications prior to visit.    Allergies[1]  Review of Systems  Constitutional:  Negative for weight loss.  HENT:  Negative for congestion and hearing loss.   Eyes:  Negative for blurred vision.  Respiratory:  Negative for cough.   Cardiovascular:  Negative for leg swelling.  Gastrointestinal:  Negative for constipation and diarrhea.  Genitourinary:  Negative for dysuria and frequency.  Musculoskeletal:  Negative for joint pain and myalgias.  Skin:  Negative for rash.  Neurological:  Negative for headaches.  Psychiatric/Behavioral:  Negative for depression. The patient is not nervous/anxious.        Objective:    Physical Exam   BP 127/66 (BP Location: Right Arm, Patient Position: Sitting, Cuff Size: Small)   Pulse 70   Temp 97.8 F (36.6 C) (Oral)   Resp 16   Ht 5' 8 (1.727 m)   Wt 186 lb (84.4 kg)   SpO2 100%   BMI 28.28 kg/m  Wt Readings from Last 3 Encounters:  01/31/24 186 lb (84.4 kg)  07/03/23 186 lb (84.4 kg)  01/02/23 182 lb (82.6 kg)   The 10-year ASCVD risk score (Arnett DK, et al., 2019) is: 5.7%   Values used to calculate the score:     Age: 40 years     Clinically relevant sex: Female     Is Non-Hispanic African American: No     Diabetic: No     Tobacco smoker: No     Systolic Blood Pressure: 127  mmHg     Is BP treated: Yes     HDL Cholesterol: 58 mg/dL     Total Cholesterol: 193 mg/dL    Physical Exam  Constitutional: She is oriented to person, place, and time. She appears well-developed and well-nourished. No distress.  HENT:  Head: Normocephalic and atraumatic.  Right Ear: Tympanic membrane and ear canal normal.  Left Ear: Tympanic membrane and ear canal normal.  Mouth/Throat: Oropharynx is clear and moist.  Eyes: Pupils are equal, round, and reactive to light. No scleral icterus.  Neck: Normal range of motion. No thyromegaly present.  Cardiovascular: Normal rate and regular rhythm.   No murmur heard. Pulmonary/Chest: Effort normal and breath sounds normal. No respiratory distress. He has no wheezes. She has no rales. She exhibits no tenderness.  Abdominal: Soft. Bowel sounds are normal. She exhibits no distension and no mass. There is no tenderness. There is no rebound and no guarding.  Musculoskeletal: She exhibits no edema.  Lymphadenopathy:    She has no cervical adenopathy.  Neurological: She is alert and oriented to person, place, and time. She has normal patellar reflexes. She exhibits normal muscle tone. Coordination normal.  Skin: Skin is warm and dry.  Psychiatric: She has a normal mood and affect. Her behavior is normal. Judgment and thought content normal.             Assessment & Plan:    Assessment & Plan:   Problem List Items Addressed This Visit       Unprioritized   Osteopenia   Hypertension - Primary   BP stable on amlodipine  5mg .      Relevant Orders   Comp  Met (CMET) (Completed)   Lipid panel (Completed)   Other Visit Diagnoses       Need for pneumococcal 20-valent conjugate vaccination       Relevant Orders   Pneumococcal conjugate vaccine 20-valent (Prevnar 20) (Completed)     Assessment and Plan Assessment & Plan Primary hypertension Blood pressure controlled at 127/66 mmHg on amlodipine . High normal potassium  contraindicates ACE inhibitors or ARBs. - Continue amlodipine  5 mg oral daily. - Repeat metabolic panel to monitor potassium levels.  Osteopenia Mild osteopenia on bone density scan. Calcium and vitamin D supplementation ongoing. - Continue calcium and vitamin D supplementation. - Repeat bone density scan in one year.  Hyperlipidemia Cholesterol slightly elevated. 10-year cardiovascular risk 5.7%, below statin threshold. Goal LDL <100 mg/dL due to hypertension. - Repeat lipid panel. - Encouraged dietary modifications and increased exercise to lower LDL.  General Health Maintenance Up to date on mammogram, colonoscopy, vision, and dental check-ups. Candidate for pneumonia vaccination. - Administered Prevnar 20 vaccine. - Continue routine health maintenance screenings.   I am having Romero MICAEL Shallow maintain her multivitamin, Calcium-Magnesium-Vitamin D (CALCIUM 500 PO), clobetasol, ketoconazole, Vitamin D3, and amLODipine .  No orders of the defined types were placed in this encounter.     [1]  Allergies Allergen Reactions   Amoxicillin -Pot Clavulanate    Shrimp [Shellfish Allergy] Nausea And Vomiting    09/29/12  Pt states she can tolerate betadine and IV dyes. Tf,cma(aama)   "

## 2024-02-01 LAB — LIPID PANEL
Cholesterol: 193 mg/dL
HDL: 58 mg/dL
LDL Cholesterol (Calc): 106 mg/dL — ABNORMAL HIGH
Non-HDL Cholesterol (Calc): 135 mg/dL — ABNORMAL HIGH
Total CHOL/HDL Ratio: 3.3 (calc)
Triglycerides: 175 mg/dL — ABNORMAL HIGH

## 2024-02-01 LAB — COMPREHENSIVE METABOLIC PANEL WITH GFR
AG Ratio: 1.5 (calc) (ref 1.0–2.5)
ALT: 16 U/L (ref 6–29)
AST: 19 U/L (ref 10–35)
Albumin: 4.5 g/dL (ref 3.6–5.1)
Alkaline phosphatase (APISO): 78 U/L (ref 37–153)
BUN: 18 mg/dL (ref 7–25)
CO2: 30 mmol/L (ref 20–32)
Calcium: 9.2 mg/dL (ref 8.6–10.4)
Chloride: 102 mmol/L (ref 98–110)
Creat: 0.56 mg/dL (ref 0.50–1.05)
Globulin: 3 g/dL (ref 1.9–3.7)
Glucose, Bld: 108 mg/dL — ABNORMAL HIGH (ref 65–99)
Potassium: 4.7 mmol/L (ref 3.5–5.3)
Sodium: 140 mmol/L (ref 135–146)
Total Bilirubin: 0.3 mg/dL (ref 0.2–1.2)
Total Protein: 7.5 g/dL (ref 6.1–8.1)
eGFR: 102 mL/min/1.73m2

## 2024-02-02 ENCOUNTER — Ambulatory Visit: Payer: Self-pay | Admitting: Family

## 2024-02-02 NOTE — Patient Instructions (Signed)
" °  VISIT SUMMARY: During your annual physical exam, we discussed your overall health and addressed a few specific concerns. Your blood pressure management is going well, and you are maintaining a healthy lifestyle with a good diet and regular exercise. We also reviewed your supplements and family medical history.  YOUR PLAN: -PRIMARY HYPERTENSION: Primary hypertension means high blood pressure without a known secondary cause. Your blood pressure is well controlled at 127/66 mmHg with your current medication, amlodipine  5 mg daily. We will continue this medication and repeat a metabolic panel to monitor your potassium levels.  -OSTEOPENIA: Osteopenia is a condition where bone mineral density is lower than normal, which can be a precursor to osteoporosis. You have mild osteopenia, and you are already taking calcium and vitamin D supplements. We will continue these supplements and repeat your bone density scan in one year.  -HYPERLIPIDEMIA: Hyperlipidemia means having high levels of lipids (fats) in your blood, such as cholesterol. Your cholesterol is slightly elevated, and your 10-year cardiovascular risk is 5.7%, which is below the threshold for starting statin medication. We will repeat your lipid panel and encourage dietary modifications and increased exercise to help lower your LDL levels.  -GENERAL HEALTH MAINTENANCE: You are up to date on your mammogram, colonoscopy, vision, and dental check-ups. We administered the Prevnar 20 vaccine today to protect against pneumonia. Continue with your routine health maintenance screenings.  INSTRUCTIONS: Please continue taking your medications and supplements as discussed. We will repeat your metabolic panel and lipid panel at your next visit. Your next bone density scan will be in one year. If you have any new symptoms or concerns, please contact our office.   Contains text generated by [employer].   "

## 2024-02-02 NOTE — Assessment & Plan Note (Signed)
" °  Cholesterol slightly elevated. 10-year cardiovascular risk 5.7%, below statin threshold. Goal LDL <100 mg/dL due to hypertension. - Repeat lipid panel. - Encouraged dietary modifications and increased exercise to lower LDL. "

## 2024-02-02 NOTE — Assessment & Plan Note (Signed)
" °  General Health Maintenance Up to date on mammogram, colonoscopy, vision, and dental check-ups. Candidate for pneumonia vaccination. - Administered Prevnar 20 vaccine. - Continue routine health maintenance screenings. "

## 2024-02-02 NOTE — Assessment & Plan Note (Signed)
" °  Mild osteopenia on bone density scan. Calcium and vitamin D supplementation ongoing. - Continue calcium and vitamin D supplementation. - Repeat bone density scan in one year.  "

## 2024-08-05 ENCOUNTER — Ambulatory Visit: Admitting: Family

## 2025-02-03 ENCOUNTER — Encounter: Admitting: Family
